# Patient Record
Sex: Female | Born: 1955 | Race: Asian | Hispanic: No | Marital: Married | Smoking: Never smoker
Health system: Southern US, Community
[De-identification: ages and names within clinical notes are randomized; demographics above are authoritative.]

## PROBLEM LIST (undated history)

## (undated) DIAGNOSIS — R51 Headache: Secondary | ICD-10-CM

## (undated) DIAGNOSIS — D259 Leiomyoma of uterus, unspecified: Secondary | ICD-10-CM

## (undated) DIAGNOSIS — Z780 Asymptomatic menopausal state: Secondary | ICD-10-CM

## (undated) DIAGNOSIS — K635 Polyp of colon: Secondary | ICD-10-CM

## (undated) DIAGNOSIS — R921 Mammographic calcification found on diagnostic imaging of breast: Secondary | ICD-10-CM

## (undated) DIAGNOSIS — E785 Hyperlipidemia, unspecified: Secondary | ICD-10-CM

## (undated) DIAGNOSIS — R519 Headache, unspecified: Secondary | ICD-10-CM

## (undated) HISTORY — DX: Hyperlipidemia, unspecified: E78.5

## (undated) HISTORY — DX: Mammographic calcification found on diagnostic imaging of breast: R92.1

## (undated) HISTORY — DX: Asymptomatic menopausal state: Z78.0

## (undated) HISTORY — PX: NO PAST SURGERIES: SHX2092

## (undated) HISTORY — DX: Headache, unspecified: R51.9

## (undated) HISTORY — DX: Polyp of colon: K63.5

## (undated) HISTORY — DX: Leiomyoma of uterus, unspecified: D25.9

## (undated) HISTORY — DX: Headache: R51

---

## 2010-09-22 ENCOUNTER — Ambulatory Visit: Payer: 59 | Admitting: Internal Medicine

## 2010-09-22 DIAGNOSIS — N951 Menopausal and female climacteric states: Secondary | ICD-10-CM

## 2010-09-22 DIAGNOSIS — J309 Allergic rhinitis, unspecified: Secondary | ICD-10-CM

## 2010-09-22 DIAGNOSIS — G43109 Migraine with aura, not intractable, without status migrainosus: Secondary | ICD-10-CM

## 2010-09-23 ENCOUNTER — Ambulatory Visit: Payer: 59 | Admitting: Internal Medicine

## 2010-09-23 ENCOUNTER — Other Ambulatory Visit (HOSPITAL_BASED_OUTPATIENT_CLINIC_OR_DEPARTMENT_OTHER): Payer: 59

## 2010-09-23 ENCOUNTER — Ambulatory Visit (HOSPITAL_BASED_OUTPATIENT_CLINIC_OR_DEPARTMENT_OTHER)
Admission: RE | Admit: 2010-09-23 | Discharge: 2010-09-23 | Disposition: A | Discharge status: Home / Self Care | Payer: 59 | Source: Ambulatory Visit | Attending: Internal Medicine | Admitting: Internal Medicine

## 2010-09-23 ENCOUNTER — Other Ambulatory Visit: Payer: Self-pay

## 2010-09-23 ENCOUNTER — Encounter (INDEPENDENT_AMBULATORY_CARE_PROVIDER_SITE_OTHER): Payer: Self-pay | Admitting: *Deleted

## 2010-09-23 ENCOUNTER — Other Ambulatory Visit: Payer: Self-pay | Admitting: Internal Medicine

## 2010-09-23 ENCOUNTER — Ambulatory Visit (INDEPENDENT_AMBULATORY_CARE_PROVIDER_SITE_OTHER)
Admission: RE | Admit: 2010-09-23 | Discharge: 2010-09-23 | Disposition: A | Discharge status: Home / Self Care | Payer: 59 | Source: Ambulatory Visit | Attending: Internal Medicine | Admitting: Internal Medicine

## 2010-09-23 DIAGNOSIS — D259 Leiomyoma of uterus, unspecified: Secondary | ICD-10-CM | POA: Insufficient documentation

## 2010-09-23 DIAGNOSIS — N938 Other specified abnormal uterine and vaginal bleeding: Secondary | ICD-10-CM

## 2010-09-23 DIAGNOSIS — Z1272 Encounter for screening for malignant neoplasm of vagina: Secondary | ICD-10-CM

## 2010-09-23 DIAGNOSIS — N946 Dysmenorrhea, unspecified: Secondary | ICD-10-CM

## 2010-09-23 DIAGNOSIS — J309 Allergic rhinitis, unspecified: Secondary | ICD-10-CM

## 2010-09-23 DIAGNOSIS — Z23 Encounter for immunization: Secondary | ICD-10-CM

## 2010-09-23 DIAGNOSIS — Z113 Encounter for screening for infections with a predominantly sexual mode of transmission: Secondary | ICD-10-CM

## 2010-09-23 DIAGNOSIS — N949 Unspecified condition associated with female genital organs and menstrual cycle: Secondary | ICD-10-CM

## 2010-09-23 DIAGNOSIS — Z01419 Encounter for gynecological examination (general) (routine) without abnormal findings: Secondary | ICD-10-CM

## 2010-09-23 DIAGNOSIS — E785 Hyperlipidemia, unspecified: Secondary | ICD-10-CM

## 2010-09-23 DIAGNOSIS — N951 Menopausal and female climacteric states: Secondary | ICD-10-CM

## 2010-09-23 DIAGNOSIS — Z139 Encounter for screening, unspecified: Secondary | ICD-10-CM

## 2010-09-26 ENCOUNTER — Telehealth: Payer: Self-pay | Admitting: Gastroenterology

## 2010-09-29 ENCOUNTER — Ambulatory Visit: Payer: 59 | Admitting: Internal Medicine

## 2010-09-30 NOTE — Letter (Signed)
Summary: Pre Visit Letter Revised  [facility]  [address] [place], [place] [address]   Phone: [phone]  Fax: [phone]        09/23/2010 MRN: [id number] Suzanne Harris [address] [place], [place]  [address]             Procedure Date:  10-28-10           Direct Colon--Dr. Arlyce Dice   Welcome to the Gastroenterology Division at [facility].    You are scheduled to see a nurse for your pre-procedure visit on 10-14-10 at 2:00p.m. on the 3rd floor at [facility], [address]  We ask that you try to arrive at our office 15 minutes prior to your appointment time to allow for check-in.  Please take a minute to review the attached form.  If you answer "Yes" to one or more of the questions on the first page, we ask that you call the person listed at your earliest opportunity.  If you answer "No" to all of the questions, please complete the rest of the form and bring it to your appointment.    Your nurse visit will consist of discussing your medical and surgical history, your immediate family medical history, and your medications.   If you are unable to list all of your medications on the form, please bring the medication bottles to your appointment and we will list them.  We will need to be aware of both prescribed and over the counter drugs.  We will need to know exact dosage information as well.    Please be prepared to read and sign documents such as consent forms, a financial agreement, and acknowledgement forms.  If necessary, and with your consent, a friend or relative is welcome to sit-in on the nurse visit with you.  Please bring your insurance card so that we may make a copy of it.  If your insurance requires a referral to see a specialist, please bring your referral form from your primary care physician.  No co-pay is required for this nurse visit.     If you cannot keep your appointment, please call [phone] to cancel or reschedule prior to  your appointment date.  This allows [place] the opportunity to schedule an appointment for another patient in need of care.    Thank you for choosing [facility] for your medical needs.  We appreciate the opportunity to care for you.  Please visit [place] at our website  to learn more about our practice.  Sincerely, The Gastroenterology Division

## 2010-10-01 ENCOUNTER — Other Ambulatory Visit: Payer: Self-pay | Admitting: Obstetrics & Gynecology

## 2010-10-01 ENCOUNTER — Encounter: Payer: 59 | Admitting: Obstetrics & Gynecology

## 2010-10-01 DIAGNOSIS — N949 Unspecified condition associated with female genital organs and menstrual cycle: Secondary | ICD-10-CM

## 2010-10-06 ENCOUNTER — Ambulatory Visit: Payer: 59 | Admitting: Internal Medicine

## 2010-10-06 DIAGNOSIS — N951 Menopausal and female climacteric states: Secondary | ICD-10-CM

## 2010-10-06 DIAGNOSIS — E785 Hyperlipidemia, unspecified: Secondary | ICD-10-CM

## 2010-10-06 DIAGNOSIS — G43909 Migraine, unspecified, not intractable, without status migrainosus: Secondary | ICD-10-CM

## 2010-10-14 NOTE — Progress Notes (Signed)
Summary: Previsit Letter(Waiting on Colon report)  Phone Note Call from Patient Call back at Home Phone [phone]   Summary of Call: Pt called stated she recieved a previsit questionare.Marland KitchenMarland KitchenShe had a colonoscopy in 2009 in [place].Marland KitchenPt is to fax [place] records of her colon and pathology report and for Dr Arlyce Dice to review to see if pt still needs to continue with previsit and procedure. Initial call taken by: Merri Ray CMA Duncan Dull),  September 26, 2010 4:33 PM  Follow-up for Phone Call        Dr Arlyce Dice, This pt is scheduled for a Previsit and a Colon but had procedure in [place] in 2009. We have not recieved her reports yet. Do you want to still keep her on the schedule? Follow-up by: Merri Ray CMA Duncan Dull),  October 03, 2010 2:30 PM  Additional Follow-up for Phone Call Additional follow up Details #1::         let's put this on hold until we were reviewing her last colonoscopy Additional Follow-up by: Louis Meckel MD,  October 03, 2010 2:58 PM    Additional Follow-up for Phone Call Additional follow up Details #2::    Called pt today, She is to sign medical release at her PCP's office today. Will fax report to me attention Robin, hopefully today Follow-up by: Merri Ray CMA Duncan Dull),  October 06, 2010 8:44 AM  Additional Follow-up for Phone Call Additional follow up Details #3:: Details for Additional Follow-up Action Taken: Dr Arlyce Dice, I have contacted this pt, but we still do not her copies of procedures available. Do you want me to cancel her appointments....YES  Called pt to inform that Dr Arlyce Dice needs to see those records. Have not recieved yet..L/M for pt if she does not have her records that she needs to call and cancel her appointments until 2009 procedure can be reviewed Additional Follow-up by: Merri Ray CMA Duncan Dull),  October 08, 2010 8:58 AM  The pt called and said she wanted to cancel the previsit and the colonscopy scheduled for 10-28-2010. She is calling her Family MD  in [place] to see if she can get her records.  She said she will call back and reschedule once she get the records.  She wanted me to let Robin know. I did go into the Epic system and cancelled the Previsit and the colonoscopy. Per Lowry Ram

## 2010-10-24 NOTE — Assessment & Plan Note (Signed)
Suzanne Harris, PERDOMO NO.:  [id number]  MEDICAL RECORD NO.:  [id number]           PATIENT TYPE:  LOCATION:  [facility]           FACILITY:  PHYSICIAN:  Allie Bossier, MD        DATE OF BIRTH:  1956-01-02  DATE OF SERVICE:  10/01/2010                                 CLINIC NOTE  Ms. Kaufhold is a 55 year old Filipino American married, G2, P2, she has 62 and 41 year old children see as a referral from Dr. Constance Goltz due to her dysfunctional uterine bleeding.  Please note that she says her last normal period was probably in August 2011, but since August, she has had some irregular bleeding, some months she will skip, some months she will have some very heavy bleeding and other months she will does have a little light spotting.  She has had no period over the last 2 months. She does complain of a year's history of hot flashes, but she says these have become increasingly worse and daily over the last 2-3 months.  She is not having intercourse for months due to her symptomatic hot flashes.  PAST MEDICAL HISTORY:  She has a known history of fibroids and was recently diagnosed with elevated cholesterol.  MEDICATIONS:  Zyrtec, multivitamin, vitamin D, calcium and she is currently on Keflex for skin infection.  REVIEW OF SYSTEMS:  She moved from [place] in about 2010.  She has been married for 32 years.  She is a Futures trader.  The remainder of review of systems questions are negative.  Her Pap smear and mammogram were both done in the last month.  No drug allergies.  She is allergic to LATEX.  FAMILY HISTORY:  Negative for breast and GYN cancers; however, first cousin did have colon cancer.  Her father died of an abdominal aortic aneurysm and her mother from complications of diabetes.  SOCIAL HISTORY:  She drinks alcohol socially, but no tobacco or drugs.  PHYSICAL EXAMINATION:  GENERAL:  Very pleasant Filipino patient. VITAL SIGNS:  Her height is 5 feet and 1  inches, weight 132 pounds, blood pressure 136/88, pulse 80. PELVIC:  External genitalia; no lesions, minimal atrophy.  She has a pubic arch that did allow the delivery of a 9 pounds baby.  Her uterus is 6-week size, midplane, mobile.  Her adnexa are nontender and her adnexa are not enlarged.  Ultrasound shows the presence of several fibroids, the largest of which is measuring 3.4 cm.  Her endometrial stripe was noted to be 9 mm and her ovaries appeared normal.  There was no adnexal masses or free fluid noted.  ASSESSMENT AND PLAN:  Dysfunctional uterine bleeding and perimenopausal women.  I prepped her cervix with iodine and grasped the anterior lip of the cervix with a single-tooth tenaculum.  I then did an endometrial biopsy with the [place], I did two passes and got an adequate amount of endometrial tissue.  She tolerated the procedure well.  She will come back on a p.r.n. basis.  She is scheduled to see Dr. Constance Goltz next week and I am hopeful that the results of the endometrial biopsy will be available then.     Allie Bossier, MD  MCD/MEDQ  D:  10/01/2010  T:  10/01/2010  Job:  [id number]  cc:   Kendrick Ranch, MD

## 2010-10-28 ENCOUNTER — Other Ambulatory Visit: Payer: 59 | Admitting: Gastroenterology

## 2010-12-08 ENCOUNTER — Ambulatory Visit (INDEPENDENT_AMBULATORY_CARE_PROVIDER_SITE_OTHER): Payer: 59 | Admitting: Internal Medicine

## 2010-12-08 DIAGNOSIS — G43109 Migraine with aura, not intractable, without status migrainosus: Secondary | ICD-10-CM

## 2010-12-08 DIAGNOSIS — E785 Hyperlipidemia, unspecified: Secondary | ICD-10-CM

## 2010-12-08 DIAGNOSIS — N951 Menopausal and female climacteric states: Secondary | ICD-10-CM

## 2010-12-17 ENCOUNTER — Encounter: Payer: Self-pay | Admitting: Internal Medicine

## 2011-10-04 ENCOUNTER — Other Ambulatory Visit: Payer: Self-pay | Admitting: Internal Medicine

## 2011-10-04 DIAGNOSIS — E785 Hyperlipidemia, unspecified: Secondary | ICD-10-CM

## 2011-10-04 DIAGNOSIS — K635 Polyp of colon: Secondary | ICD-10-CM

## 2011-10-04 DIAGNOSIS — D219 Benign neoplasm of connective and other soft tissue, unspecified: Secondary | ICD-10-CM

## 2011-10-04 DIAGNOSIS — Z780 Asymptomatic menopausal state: Secondary | ICD-10-CM

## 2011-10-04 DIAGNOSIS — N951 Menopausal and female climacteric states: Secondary | ICD-10-CM

## 2011-10-05 DIAGNOSIS — N951 Menopausal and female climacteric states: Secondary | ICD-10-CM | POA: Insufficient documentation

## 2011-10-05 DIAGNOSIS — E785 Hyperlipidemia, unspecified: Secondary | ICD-10-CM | POA: Insufficient documentation

## 2011-10-05 DIAGNOSIS — Z780 Asymptomatic menopausal state: Secondary | ICD-10-CM | POA: Insufficient documentation

## 2011-10-05 DIAGNOSIS — D219 Benign neoplasm of connective and other soft tissue, unspecified: Secondary | ICD-10-CM | POA: Insufficient documentation

## 2011-10-05 DIAGNOSIS — K635 Polyp of colon: Secondary | ICD-10-CM | POA: Insufficient documentation

## 2011-10-05 DIAGNOSIS — J309 Allergic rhinitis, unspecified: Secondary | ICD-10-CM | POA: Insufficient documentation

## 2011-10-05 NOTE — Telephone Encounter (Signed)
Suzanne Harris,  Call pt and tell her she is overdue for her mammogram.  I will refill her estrogen as soon as I have mammogram report.  Also make sure she has her prometrium to take every day with her estrogen.  She is overdue for a CPE (last done 09/23/2010).  Schedule this with her if she wishes.

## 2011-10-05 NOTE — Telephone Encounter (Signed)
Spoke with Suzanne Harris.  She states that she is out of [employer].  I advised her that, per DDS, she can refill once she has mammogram report- for pt safety.  She states that she has not yet scheduled mammogram.  I offered to transfer the call to imaging, but she denied stating she needed to check her calendar for the next week.  I also advised her she is due for CPE, she states she has a lot going on right now and will call back for CPE appt.  She states she has been taking prometrium with Elestrin, but stopped when she ran out of [employer]

## 2011-10-06 ENCOUNTER — Other Ambulatory Visit: Payer: Self-pay | Admitting: Internal Medicine

## 2011-10-06 DIAGNOSIS — Z1231 Encounter for screening mammogram for malignant neoplasm of breast: Secondary | ICD-10-CM

## 2011-10-07 ENCOUNTER — Ambulatory Visit (HOSPITAL_BASED_OUTPATIENT_CLINIC_OR_DEPARTMENT_OTHER)
Admission: RE | Admit: 2011-10-07 | Discharge: 2011-10-07 | Disposition: A | Discharge status: Home / Self Care | Payer: 59 | Source: Ambulatory Visit | Attending: Internal Medicine | Admitting: Internal Medicine

## 2011-10-07 DIAGNOSIS — Z1231 Encounter for screening mammogram for malignant neoplasm of breast: Secondary | ICD-10-CM

## 2011-10-12 ENCOUNTER — Telehealth: Payer: Self-pay | Admitting: Internal Medicine

## 2011-10-12 MED ORDER — ESTRADIOL 0.52 MG/0.87 GM (0.06%) TD GEL: TRANSDERMAL | Status: DC

## 2011-10-12 NOTE — Telephone Encounter (Signed)
Pt aware mammogram okay and Elestrin refilled.  Advised she will call back to schedule CPE when she has her calendar available

## 2011-10-12 NOTE — Telephone Encounter (Signed)
Call pt and let her know that her mammogram is fine and I reordered her elestrin.  See if you can schedule a CPE for her and message back with her response.

## 2011-11-19 ENCOUNTER — Encounter: Payer: Self-pay | Admitting: Internal Medicine

## 2011-11-19 ENCOUNTER — Ambulatory Visit (INDEPENDENT_AMBULATORY_CARE_PROVIDER_SITE_OTHER): Payer: 59 | Admitting: Internal Medicine

## 2011-11-19 VITALS — BP 136/84 | HR 76 | Temp 97.1°F | Resp 16 | Ht 61.0 in | Wt 127.0 lb

## 2011-11-19 DIAGNOSIS — E785 Hyperlipidemia, unspecified: Secondary | ICD-10-CM

## 2011-11-19 DIAGNOSIS — E28319 Asymptomatic premature menopause: Secondary | ICD-10-CM

## 2011-11-19 DIAGNOSIS — Z8742 Personal history of other diseases of the female genital tract: Secondary | ICD-10-CM

## 2011-11-19 DIAGNOSIS — R921 Mammographic calcification found on diagnostic imaging of breast: Secondary | ICD-10-CM | POA: Insufficient documentation

## 2011-11-19 DIAGNOSIS — R928 Other abnormal and inconclusive findings on diagnostic imaging of breast: Secondary | ICD-10-CM

## 2011-11-19 LAB — CBC WITH DIFFERENTIAL/PLATELET
Eosinophils Absolute: 0.2 10*3/uL (ref 0.0–0.7)
Hemoglobin: 14.8 g/dL (ref 12.0–15.0)
Lymphocytes Relative: 32 % (ref 12–46)
Lymphs Abs: 1.5 10*3/uL (ref 0.7–4.0)
MCH: 31.4 pg (ref 26.0–34.0)
Monocytes Relative: 6 % (ref 3–12)
Neutro Abs: 2.7 10*3/uL (ref 1.7–7.7)
Neutrophils Relative %: 58 % (ref 43–77)
Platelets: 198 10*3/uL (ref 150–400)
RBC: 4.71 MIL/uL (ref 3.87–5.11)
WBC: 4.7 10*3/uL (ref 4.0–10.5)

## 2011-11-19 LAB — COMPLETE METABOLIC PANEL WITH GFR
ALT: 29 U/L (ref 0–35)
BUN: 13 mg/dL (ref 6–23)
CO2: 25 mEq/L (ref 19–32)
Calcium: 9.1 mg/dL (ref 8.4–10.5)
Chloride: 103 mEq/L (ref 96–112)
Creat: 0.73 mg/dL (ref 0.50–1.10)
GFR, Est African American: >89 mL/min
GFR, Est Non African American: >89 mL/min
Glucose, Bld: 100 mg/dL — ABNORMAL HIGH (ref 70–99)
Total Bilirubin: 0.7 mg/dL (ref 0.3–1.2)

## 2011-11-19 LAB — LIPID PANEL
Cholesterol: 232 mg/dL — ABNORMAL HIGH (ref 0–200)
HDL: 59 mg/dL (ref >39)
Total CHOL/HDL Ratio: 3.9 Ratio
Triglycerides: 111 mg/dL (ref <150)
VLDL: 22 mg/dL (ref 0–40)

## 2011-11-19 MED ORDER — ESTRADIOL 0.52 MG/0.87 GM (0.06%) TD GEL: TRANSDERMAL | Status: DC

## 2011-11-19 MED ORDER — PROGESTERONE MICRONIZED 100 MG PO CAPS: 100.0000 mg | ORAL_CAPSULE | Freq: Every day | ORAL | Status: DC

## 2011-11-19 NOTE — Progress Notes (Signed)
Subjective:    Patient ID: Suzanne Harris, female    DOB: 11/09/1955, 56 y.o.   MRN: [id number]  HPI Suzanne Harris is here to follow up on her hyperlipidemia and HT.  She reports she has been following DASH diet.  See last lipids.  Lipids markedly improved from prior testing  She is fasting today  She does report that she still has some monthly bleeding.  Last month it was "heavy"  TVUS revealed 3 fibroids and enodmetrial bx done 10/01/2010 showed atrophic endometrium w/o evidence of malignancy.  Vasomotor symptoms markedly improved with combination of Elestrin and Prometrium 100 mg daily.   No Known Allergies Past Medical History  Diagnosis Date  . Menopause     with vasomotor flushes  . Allergic rhinitis   . Colon polyps   . Generalized headaches     ? migraine  . Breast calcifications   . Uterine fibroid     negative biopsy 2/12  . Hyperlipidemia   . Facial palsy as birth trauma    Past Surgical History  Procedure Date  . No past surgeries    History   Social History  . Marital Status: Married    Spouse Name: [name]    Number of Children: 2  . Years of Education: college   Occupational History  . Homemaker    Social History Main Topics  . Smoking status: Never Smoker   . Smokeless tobacco: Never Used  . Alcohol Use: 0.0 oz/week    3-5 drink(s) per week  . Drug Use: No  . Sexually Active: Yes -- Female partner(s)    Birth Control/ Protection: Post-menopausal   Other Topics Concern  . Not on file   Social History Narrative  . No narrative on file   Family History  Problem Relation Age of Onset  . Diabetes    . Hypertension    . Colon cancer Cousin    Patient Active Problem List  Diagnoses  . Allergic rhinitis  . Menopause  . Hot flushes, perimenopausal  . Colon polyps  . Generalized headaches  . Fibroids  . Hyperlipidemia  . Facial palsy as birth trauma   Current Outpatient Prescriptions on File Prior to Visit  Medication Sig Dispense Refill  .  cetirizine (ZYRTEC) 10 MG tablet Take 10 mg by mouth daily.        . Estradiol (ELESTRIN) 0.52 MG/0.87 GM (0.06%) GEL 2 pumps to arm daily  2 Bottle  1  . Multiple Vitamin (MULTIVITAMIN) tablet Take 1 tablet by mouth daily.        . progesterone (PROMETRIUM) 100 MG capsule Take 100 mg by mouth daily.           Review of Systems    see HPI Objective:   Physical Exam Physical Exam  Nursing note and vitals reviewed.  Constitutional: She is oriented to person, place, and time. She appears well-developed and well-nourished.  HENT:  Head: Normocephalic and atraumatic.  Cardiovascular: Normal rate and regular rhythm. Exam reveals no gallop and no friction rub.  No murmur heard.  Pulmonary/Chest: Breath sounds normal. She has no wheezes. She has no rales.  Neurological: She is alert and oriented to person, place, and time.  Skin: Skin is warm and dry.  Psychiatric: She has a normal mood and affect. Her behavior is normal.        Assessment & Plan:  1)  Hyperlipidemia  Will check fasting today 2)  Menstrual bleeding  Will recheck pelvic/ TVUS to assess  endometrial thickness.   3)  Vasomotor flushing well controlled.  Pt again counseld if any Le edema or calf/thigh pain to calloffice immediately to be evaluated for DVT.  She voices understanding  Schedule CPE

## 2011-11-19 NOTE — Patient Instructions (Signed)
Schedule CPE with me  To have pelvic ultrasound  Labs will be mailed to you

## 2011-11-27 NOTE — Progress Notes (Signed)
Appt made for 12/02/11 with Dr Constance Goltz to discuss elevated lipids.

## 2011-12-02 ENCOUNTER — Ambulatory Visit (INDEPENDENT_AMBULATORY_CARE_PROVIDER_SITE_OTHER): Payer: 59 | Admitting: Internal Medicine

## 2011-12-02 ENCOUNTER — Encounter: Payer: Self-pay | Admitting: Internal Medicine

## 2011-12-02 VITALS — BP 138/80 | HR 86 | Temp 97.9°F | Resp 16 | Ht 61.0 in | Wt 127.0 lb

## 2011-12-02 DIAGNOSIS — E785 Hyperlipidemia, unspecified: Secondary | ICD-10-CM

## 2011-12-02 MED ORDER — SIMVASTATIN 5 MG PO TABS: 5.0000 mg | ORAL_TABLET | Freq: Every day | ORAL | Status: DC

## 2011-12-02 NOTE — Patient Instructions (Signed)
Take med daily  See me in 2 monthsd

## 2011-12-02 NOTE — Progress Notes (Signed)
Subjective:    Patient ID: Suzanne Harris, female    DOB: [birth year], 56 y.o.   MRN: [id number]  HPI  Sheniece is here to follow up on her hyperlipidemia.  Strong FH of high cholesterol but no MI that she is aware of.   Father died of a femoral aneurysm.    Framingham risk calculator shows 9.9% CV for ensuing 10 years.  Reviewed with pt  No Known Allergies Past Medical History  Diagnosis Date  . Menopause     with vasomotor flushes  . Allergic rhinitis   . Colon polyps   . Generalized headaches     ? migraine  . Breast calcifications   . Uterine fibroid     negative biopsy 2/12  . Hyperlipidemia   . Facial palsy as birth trauma    Past Surgical History  Procedure Date  . No past surgeries    History   Social History  . Marital Status: Married    Spouse Name: [name]    Number of Children: 2  . Years of Education: college   Occupational History  . Homemaker    Social History Main Topics  . Smoking status: Never Smoker   . Smokeless tobacco: Never Used  . Alcohol Use: 0.0 oz/week    3-5 drink(s) per week  . Drug Use: No  . Sexually Active: Yes -- Female partner(s)    Birth Control/ Protection: Post-menopausal   Other Topics Concern  . Not on file   Social History Narrative  . No narrative on file   Family History  Problem Relation Age of Onset  . Diabetes    . Hypertension    . Colon cancer Cousin    Patient Active Problem List  Diagnoses  . Allergic rhinitis  . Menopause  . Hot flushes, perimenopausal  . Colon polyps  . Generalized headaches  . Fibroids  . Hyperlipidemia  . Facial palsy as birth trauma  . Breast calcifications   Current Outpatient Prescriptions on File Prior to Visit  Medication Sig Dispense Refill  . cetirizine (ZYRTEC) 10 MG tablet Take 10 mg by mouth daily.        . Estradiol (ELESTRIN) 0.52 MG/0.87 GM (0.06%) GEL 2 pumps to arm daily  2 Bottle  1  . Multiple Vitamin (MULTIVITAMIN) tablet Take 1 tablet by mouth daily.          . progesterone (PROMETRIUM) 100 MG capsule Take 1 capsule (100 mg total) by mouth daily.  30 capsule  5  . simvastatin (ZOCOR) 5 MG tablet Take 1 tablet (5 mg total) by mouth at bedtime.  30 tablet  2       Review of Systems    see HPI Objective:   Physical Exam Physical Exam  Nursing note and vitals reviewed.  Constitutional: She is oriented to person, place, and time. She appears well-developed and well-nourished.  HENT:  Head: Normocephalic and atraumatic.  Cardiovascular: Normal rate and regular rhythm. Exam reveals no gallop and no friction rub.  No murmur heard.  Pulmonary/Chest: Breath sounds normal. She has no wheezes. She has no rales.  Neurological: She is alert and oriented to person, place, and time.  Skin: Skin is warm and dry.  Psychiatric: She has a normal mood and affect. Her behavior is normal.       Assessment & Plan:  1)  Hyperlipidemia  Extensively reviewed Se profile of startins including Lft elevation and myalgias.  Will try low dose Zocor daily or 3 times  per week.  Pt agreeable   See me in 2 months  2)  Elevated BP  Repeat exam is withing normal range.  Watch expectantly

## 2011-12-22 ENCOUNTER — Ambulatory Visit (HOSPITAL_BASED_OUTPATIENT_CLINIC_OR_DEPARTMENT_OTHER)
Admission: RE | Admit: 2011-12-22 | Discharge: 2011-12-22 | Disposition: A | Discharge status: Home / Self Care | Payer: 59 | Source: Ambulatory Visit | Attending: Internal Medicine | Admitting: Internal Medicine

## 2011-12-22 DIAGNOSIS — N852 Hypertrophy of uterus: Secondary | ICD-10-CM | POA: Insufficient documentation

## 2011-12-22 DIAGNOSIS — D259 Leiomyoma of uterus, unspecified: Secondary | ICD-10-CM | POA: Insufficient documentation

## 2011-12-22 DIAGNOSIS — N938 Other specified abnormal uterine and vaginal bleeding: Secondary | ICD-10-CM | POA: Insufficient documentation

## 2011-12-22 DIAGNOSIS — N949 Unspecified condition associated with female genital organs and menstrual cycle: Secondary | ICD-10-CM | POA: Insufficient documentation

## 2011-12-22 DIAGNOSIS — Z8742 Personal history of other diseases of the female genital tract: Secondary | ICD-10-CM

## 2011-12-28 ENCOUNTER — Telehealth: Payer: Self-pay | Admitting: Internal Medicine

## 2011-12-28 DIAGNOSIS — D259 Leiomyoma of uterus, unspecified: Secondary | ICD-10-CM

## 2011-12-28 NOTE — Telephone Encounter (Signed)
Spoke with pt and informed of fibroids and uterine enlargement with ??? Adenomyoisis.  She reports she continues to bleed monthly , sometimes 5 weeks between cycles.  Will refer to Dr. Marice Potter who she has seen in past to discuss options to control bleeding.  There is no anemia and endometrial stripe is thin

## 2012-01-13 ENCOUNTER — Ambulatory Visit (INDEPENDENT_AMBULATORY_CARE_PROVIDER_SITE_OTHER): Payer: 59 | Admitting: Obstetrics & Gynecology

## 2012-01-13 ENCOUNTER — Encounter: Payer: Self-pay | Admitting: Obstetrics & Gynecology

## 2012-01-13 VITALS — BP 134/79 | HR 78 | Temp 98.6°F | Resp 16 | Ht 61.0 in | Wt 125.0 lb

## 2012-01-13 DIAGNOSIS — N950 Postmenopausal bleeding: Secondary | ICD-10-CM

## 2012-01-13 NOTE — Progress Notes (Signed)
  Subjective:    Patient ID: Suzanne Harris, female    DOB: 01-23-1956, 56 y.o.   MRN: [id number]  HPI  Suzanne Harris is a lovely 56 yo lady with known fibroids. She was sent here for evaluation of PMB related either to her fibroids or her continuous HRT. She says that she has a little spotting about every 2 weeks. She denies dysparunia or pelvic pain. The spotting is not that concerning to her. She had an u/s that showed an endometrial lining of 3 mm. Her uterus is now 13.2 cm (was 10.9 cm).  Review of Systems   She is having a pap smear next month.  Objective:   Physical Exam        Assessment & Plan:   As above- I have offered her watchful waiting versus changing her HRT to cyclic fashion so that she would have a "period" once a month instead of bimonthly spotting. She prefers the status quo. She will get her pap next month and come to me if she develops any pelvic pain or dysparunia.

## 2012-02-02 ENCOUNTER — Ambulatory Visit (HOSPITAL_BASED_OUTPATIENT_CLINIC_OR_DEPARTMENT_OTHER)
Admission: RE | Admit: 2012-02-02 | Discharge: 2012-02-02 | Disposition: A | Discharge status: Home / Self Care | Payer: 59 | Source: Ambulatory Visit | Attending: Internal Medicine | Admitting: Internal Medicine

## 2012-02-02 ENCOUNTER — Encounter: Payer: Self-pay | Admitting: Internal Medicine

## 2012-02-02 ENCOUNTER — Ambulatory Visit (INDEPENDENT_AMBULATORY_CARE_PROVIDER_SITE_OTHER): Payer: 59 | Admitting: Internal Medicine

## 2012-02-02 VITALS — BP 132/84 | HR 64 | Temp 97.5°F | Resp 16 | Ht 61.0 in | Wt 124.0 lb

## 2012-02-02 DIAGNOSIS — N951 Menopausal and female climacteric states: Secondary | ICD-10-CM

## 2012-02-02 DIAGNOSIS — E785 Hyperlipidemia, unspecified: Secondary | ICD-10-CM

## 2012-02-02 DIAGNOSIS — M2550 Pain in unspecified joint: Secondary | ICD-10-CM | POA: Insufficient documentation

## 2012-02-02 DIAGNOSIS — N800 Endometriosis of uterus: Secondary | ICD-10-CM

## 2012-02-02 DIAGNOSIS — D259 Leiomyoma of uterus, unspecified: Secondary | ICD-10-CM

## 2012-02-02 DIAGNOSIS — D219 Benign neoplasm of connective and other soft tissue, unspecified: Secondary | ICD-10-CM

## 2012-02-02 DIAGNOSIS — K635 Polyp of colon: Secondary | ICD-10-CM

## 2012-02-02 DIAGNOSIS — Z780 Asymptomatic menopausal state: Secondary | ICD-10-CM

## 2012-02-02 DIAGNOSIS — Z0000 Encounter for general adult medical examination without abnormal findings: Secondary | ICD-10-CM

## 2012-02-02 DIAGNOSIS — N898 Other specified noninflammatory disorders of vagina: Secondary | ICD-10-CM

## 2012-02-02 DIAGNOSIS — D126 Benign neoplasm of colon, unspecified: Secondary | ICD-10-CM

## 2012-02-02 DIAGNOSIS — N939 Abnormal uterine and vaginal bleeding, unspecified: Secondary | ICD-10-CM | POA: Insufficient documentation

## 2012-02-02 DIAGNOSIS — Z01419 Encounter for gynecological examination (general) (routine) without abnormal findings: Secondary | ICD-10-CM

## 2012-02-02 LAB — POCT URINALYSIS DIPSTICK
Glucose, UA: NEGATIVE
Ketones, UA: NEGATIVE
Leukocytes, UA: NEGATIVE
Protein, UA: NEGATIVE
Spec Grav, UA: <=1.005
Urobilinogen, UA: NEGATIVE

## 2012-02-02 LAB — TSH: TSH: 1.485 u[IU]/mL (ref 0.350–4.500)

## 2012-02-02 MED ORDER — NABUMETONE 500 MG PO TABS: ORAL_TABLET | ORAL | Status: DC

## 2012-02-02 NOTE — Patient Instructions (Addendum)
Call office tommorrow

## 2012-02-02 NOTE — Progress Notes (Signed)
Subjective:    Patient ID: Suzanne Harris, female    DOB: Jan 07, 1956, 56 y.o.   MRN: [id number]  HPI Suzanne Harris is here for comprehensive eval.    She did see Dr. Marice Potter for her vaginal spotting and chose not to change her HT now - she has known fibroids and uterine enlargement by about 3 cm.  She did not want cyclic HT .  She denies dyspareunia.  She did not see Dr. Arlyce Dice as yet for her colonoscopy as she was trying to get records from [facility] but has not found them.  She believes her last colonoscopy was 2008 or 2009.    Marland Kitchen  Vasomotor symptoms under control.   She does note swelling in joints of her fingers , esp DIp joint of R 3rd digit. No redness or warmth.  No FH of arthritis.  She has been working in yard quite a bit.     No Known Allergies Past Medical History  Diagnosis Date  . Menopause     with vasomotor flushes  . Allergic rhinitis   . Colon polyps   . Generalized headaches     ? migraine  . Breast calcifications   . Uterine fibroid     negative biopsy 2/12  . Hyperlipidemia   . Facial palsy as birth trauma    Past Surgical History  Procedure Date  . No past surgeries    History   Social History  . Marital Status: Married    Spouse Name: [name]    Number of Children: 2  . Years of Education: college   Occupational History  . Homemaker    Social History Main Topics  . Smoking status: Never Smoker   . Smokeless tobacco: Never Used  . Alcohol Use: 0.0 oz/week    3-5 drink(s) per week  . Drug Use: No  . Sexually Active: Yes -- Female partner(s)    Birth Control/ Protection: Post-menopausal   Other Topics Concern  . Not on file   Social History Narrative  . No narrative on file   Family History  Problem Relation Age of Onset  . Diabetes    . Hypertension    . Colon cancer Cousin    Patient Active Problem List  Diagnosis  . Allergic rhinitis  . Menopause  . Hot flushes, perimenopausal  . Colon polyps  . Generalized headaches  . Fibroids  .  Hyperlipidemia  . Facial palsy as birth trauma  . Breast calcifications  . Arthralgia  . Uterus, adenomyosis  . Vaginal spotting   Current Outpatient Prescriptions on File Prior to Visit  Medication Sig Dispense Refill  . cetirizine (ZYRTEC) 10 MG tablet Take 10 mg by mouth daily.        . Estradiol (ELESTRIN) 0.52 MG/0.87 GM (0.06%) GEL 2 pumps to arm daily  2 Bottle  1  . Multiple Vitamin (MULTIVITAMIN) tablet Take 1 tablet by mouth daily.        . progesterone (PROMETRIUM) 100 MG capsule Take 1 capsule (100 mg total) by mouth daily.  30 capsule  5  . simvastatin (ZOCOR) 5 MG tablet Take 1 tablet (5 mg total) by mouth at bedtime.  30 tablet  2       Review of Systems  Constitutional: Negative.   HENT: Negative.   Eyes: Negative.   Respiratory: Negative.   Cardiovascular: Negative.   Gastrointestinal: Negative.   Genitourinary: Negative.   Musculoskeletal: Positive for joint swelling and arthralgias.  Skin: Negative.   Psychiatric/Behavioral:  Negative.        Objective:   Physical Exam  Physical Exam  Vital signs and nursing note reviewed  Constitutional: She is oriented to person, place, and time. She appears well-developed and well-nourished. She is cooperative.  HENT:  Head: Normocephalic and atraumatic.  Right Ear: Tympanic membrane normal.  Left Ear: Tympanic membrane normal.  Nose: Nose normal.  Mouth/Throat: Oropharynx is clear and moist and mucous membranes are normal. No oropharyngeal exudate or posterior oropharyngeal erythema.  Eyes: Conjunctivae and EOM are normal. Pupils are equal, round, and reactive to light.  Neck: Neck supple. No JVD present. Carotid bruit is not present. No mass and no thyromegaly present.  Cardiovascular: Regular rhythm, normal heart sounds, intact distal pulses and normal pulses.  Exam reveals no gallop and no friction rub.   No murmur heard. Pulses:      Dorsalis pedis pulses are 2+ on the right side, and 2+ on the left side.    Pulmonary/Chest: Breath sounds normal. She has no wheezes. She has no rhonchi. She has no rales. Right breast exhibits no mass, no nipple discharge and no skin change. Left breast exhibits no mass, no nipple discharge and no skin change.  Abdominal: Soft. Bowel sounds are normal. She exhibits no distension and no mass. There is no hepatosplenomegaly. There is no tenderness. There is no CVA tenderness.  Genitourinary: Rectum normal, vagina normal and uterus normal. Rectal exam shows no mass. Guaiac negative stool. No labial fusion. There is no lesion on the right labia. There is no lesion on the left labia. Cervix exhibits no motion tenderness. Right adnexum displays no mass, no tenderness and no fullness. Left adnexum displays no mass, no tenderness and no fullness. No erythema around the vagina.  Musculoskeletal:       She does have signifgicant edema of 3rd digit R hand and heberdon's and Bouchard's nodes of both hands.    No redness or warmth Lymphadenopathy:       Right cervical: No superficial cervical adenopathy present.      Left cervical: No superficial cervical adenopathy present.       Right axillary: No pectoral and no lateral adenopathy present.       Left axillary: No pectoral and no lateral adenopathy present.      Right: No inguinal adenopathy present.       Left: No inguinal adenopathy present.  Neurological: She is alert and oriented to person, place, and time. She has normal strength and normal reflexes. No cranial nerve deficit or sensory deficit. She displays a negative Romberg sign. Coordination and gait normal.  Skin: Skin is warm and dry. No abrasion, no bruising, no ecchymosis and no rash noted. No cyanosis. Nails show no clubbing.  Psychiatric: She has a normal mood and affect. Her speech is normal and behavior is normal.          Assessment & Plan:   Health Maintenance  UTD on vaccines and mammogram. I gave pt phone number to Dr. Marzetta Board office to reschedule visit.   She did have a polyp removed in [place].  Gave info on shingles vaccine and she is going to check with insurance company  Hyperlipidemia  Tolerating low dose Zocor well will check lipids today  Arthralgias:  Will check arthriitis panel and x-rays today.  Advised Relafen 500 mg bid along with 20 mg of Prevacid.   She voiced understanding  Vaginal spotting/fibroids/ uterine adenomyosis  See dr. Ellin Saba note.  If any bothersome symptoms she  is aware to follow with Dr. Marice Potter  History of colonic polyp  I gave pt number to Dr.Kaplan;s office for pt to reschedule  Vasomotor flushes well controlled  Allergic rhinitis     History of breast calcifications       Assessment & Plan:

## 2012-02-03 ENCOUNTER — Telehealth: Payer: Self-pay | Admitting: Internal Medicine

## 2012-02-03 LAB — LIPID PANEL
LDL Cholesterol: 99 mg/dL (ref 0–99)
Triglycerides: 189 mg/dL — ABNORMAL HIGH (ref <150)
VLDL: 38 mg/dL (ref 0–40)

## 2012-02-03 LAB — COMPREHENSIVE METABOLIC PANEL
ALT: 18 U/L (ref 0–35)
Albumin: 4.5 g/dL (ref 3.5–5.2)
Alkaline Phosphatase: 43 U/L (ref 39–117)
Glucose, Bld: 85 mg/dL (ref 70–99)
Potassium: 4.4 mEq/L (ref 3.5–5.3)
Sodium: 139 mEq/L (ref 135–145)
Total Bilirubin: 0.7 mg/dL (ref 0.3–1.2)
Total Protein: 7 g/dL (ref 6.0–8.3)

## 2012-02-03 LAB — ANA: Anti Nuclear Antibody(ANA): NEGATIVE

## 2012-02-03 LAB — RHEUMATOID FACTOR: Rhuematoid fact SerPl-aCnc: <10 IU/mL (ref <=14)

## 2012-02-03 LAB — SEDIMENTATION RATE: Sed Rate: 4 mm/hr (ref 0–22)

## 2012-02-03 NOTE — Telephone Encounter (Signed)
Attempted to call pt regarding x-rays and labs.  Left message to call office on Monday

## 2012-02-08 ENCOUNTER — Telehealth: Payer: Self-pay | Admitting: Internal Medicine

## 2012-02-08 DIAGNOSIS — M1990 Unspecified osteoarthritis, unspecified site: Secondary | ICD-10-CM

## 2012-02-08 NOTE — Telephone Encounter (Signed)
Pt states she was returning phone call Dr. Constance Goltz called... Please call pt back at [phone]

## 2012-02-08 NOTE — Telephone Encounter (Signed)
Spoke with pt and informed of labs and X-ray results.  With small erosions of R hand will refer to rheumatology  She voices understanding

## 2012-02-09 ENCOUNTER — Other Ambulatory Visit: Payer: Self-pay | Admitting: Internal Medicine

## 2012-02-09 ENCOUNTER — Telehealth: Payer: Self-pay | Admitting: *Deleted

## 2012-02-09 NOTE — Telephone Encounter (Signed)
Copy of labs mailed to pt's home address. 

## 2012-02-21 ENCOUNTER — Other Ambulatory Visit: Payer: Self-pay | Admitting: Internal Medicine

## 2012-02-26 ENCOUNTER — Other Ambulatory Visit: Payer: Self-pay | Admitting: Internal Medicine

## 2012-04-26 ENCOUNTER — Other Ambulatory Visit: Payer: Self-pay | Admitting: Internal Medicine

## 2012-05-02 ENCOUNTER — Other Ambulatory Visit: Payer: Self-pay | Admitting: Internal Medicine

## 2012-05-16 ENCOUNTER — Telehealth: Payer: Self-pay | Admitting: *Deleted

## 2012-05-16 NOTE — Telephone Encounter (Signed)
Pt will come in Wed AM to receive vaccine.

## 2012-05-18 ENCOUNTER — Ambulatory Visit (INDEPENDENT_AMBULATORY_CARE_PROVIDER_SITE_OTHER): Payer: 59 | Admitting: *Deleted

## 2012-05-18 DIAGNOSIS — Z23 Encounter for immunization: Secondary | ICD-10-CM

## 2012-06-24 ENCOUNTER — Other Ambulatory Visit: Payer: Self-pay | Admitting: Internal Medicine

## 2012-06-24 NOTE — Telephone Encounter (Signed)
Needs refill

## 2012-07-14 ENCOUNTER — Telehealth: Payer: Self-pay | Admitting: Internal Medicine

## 2012-07-14 NOTE — Telephone Encounter (Signed)
Pt would like know if her medical records [place] her [place] has been received..please call pt on her cell number to advise either way.. Thanks

## 2012-07-23 ENCOUNTER — Other Ambulatory Visit: Payer: Self-pay | Admitting: Internal Medicine

## 2012-07-25 ENCOUNTER — Other Ambulatory Visit: Payer: Self-pay | Admitting: *Deleted

## 2012-07-25 NOTE — Telephone Encounter (Signed)
Refill request

## 2012-07-25 NOTE — Telephone Encounter (Signed)
Called in Elestrin ointment to [facility]

## 2012-09-27 ENCOUNTER — Other Ambulatory Visit: Payer: Self-pay | Admitting: Internal Medicine

## 2012-09-27 NOTE — Telephone Encounter (Signed)
Refill request

## 2013-03-18 ENCOUNTER — Other Ambulatory Visit: Payer: Self-pay | Admitting: Internal Medicine

## 2013-03-20 NOTE — Telephone Encounter (Signed)
Refill request  Pt will come in next week to have labs drawn CPE scheduled for Dec.

## 2013-04-24 ENCOUNTER — Other Ambulatory Visit: Payer: Self-pay | Admitting: *Deleted

## 2013-04-24 ENCOUNTER — Ambulatory Visit (HOSPITAL_BASED_OUTPATIENT_CLINIC_OR_DEPARTMENT_OTHER)
Admission: RE | Admit: 2013-04-24 | Discharge: 2013-04-24 | Disposition: A | Discharge status: Home / Self Care | Payer: BC Managed Care – PPO | Source: Ambulatory Visit | Attending: Internal Medicine | Admitting: Internal Medicine

## 2013-04-24 DIAGNOSIS — Z139 Encounter for screening, unspecified: Secondary | ICD-10-CM

## 2013-04-24 DIAGNOSIS — Z1231 Encounter for screening mammogram for malignant neoplasm of breast: Secondary | ICD-10-CM | POA: Insufficient documentation

## 2013-04-24 DIAGNOSIS — E785 Hyperlipidemia, unspecified: Secondary | ICD-10-CM

## 2013-04-24 DIAGNOSIS — Z780 Asymptomatic menopausal state: Secondary | ICD-10-CM

## 2013-04-24 LAB — COMPREHENSIVE METABOLIC PANEL
ALT: 27 U/L (ref 0–35)
AST: 23 U/L (ref 0–37)
BUN: 17 mg/dL (ref 6–23)
Calcium: 9.9 mg/dL (ref 8.4–10.5)
Chloride: 101 mEq/L (ref 96–112)
Creat: 0.74 mg/dL (ref 0.50–1.10)
Total Bilirubin: 0.6 mg/dL (ref 0.3–1.2)

## 2013-04-24 LAB — LIPID PANEL
HDL: 49 mg/dL (ref >39)
LDL Cholesterol: 133 mg/dL — ABNORMAL HIGH (ref 0–99)
Total CHOL/HDL Ratio: 4.5 Ratio
Triglycerides: 199 mg/dL — ABNORMAL HIGH (ref <150)

## 2013-04-24 LAB — CBC WITH DIFFERENTIAL/PLATELET
Basophils Absolute: 0 10*3/uL (ref 0.0–0.1)
Eosinophils Absolute: 0.2 10*3/uL (ref 0.0–0.7)
Eosinophils Relative: 4 % (ref 0–5)
HCT: 41.5 % (ref 36.0–46.0)
Lymphocytes Relative: 34 % (ref 12–46)
MCH: 30.7 pg (ref 26.0–34.0)
MCHC: 34.2 g/dL (ref 30.0–36.0)
MCV: 89.6 fL (ref 78.0–100.0)
Monocytes Absolute: 0.3 10*3/uL (ref 0.1–1.0)
Platelets: 198 10*3/uL (ref 150–400)
RDW: 13.8 % (ref 11.5–15.5)
WBC: 4.3 10*3/uL (ref 4.0–10.5)

## 2013-04-24 LAB — TSH: TSH: 0.981 u[IU]/mL (ref 0.350–4.500)

## 2013-04-26 ENCOUNTER — Telehealth: Payer: Self-pay | Admitting: *Deleted

## 2013-04-26 ENCOUNTER — Encounter: Payer: Self-pay | Admitting: *Deleted

## 2013-04-26 MED ORDER — SIMVASTATIN 10 MG PO TABS: 10.0000 mg | ORAL_TABLET | Freq: Every day | ORAL | Status: DC

## 2013-04-26 NOTE — Telephone Encounter (Signed)
Message copied by Mathews Robinsons on Wed Apr 26, 2013 11:51 AM ------      Message from: Raechel Chute D      Created: Wed Apr 26, 2013  7:43 AM       Karen Kitchens            Call pt and let her know that her cholesterol is slightly improved. To reach ideal goal she can take two tablets of her 5 mg simvastatin (Zocor) daily            If she wishes to see me in office can give OV to discuss            Advise her to keep her appt with me in December            For Triglycerides she can take OTC fish oil  Follow bottle directions            Ok to mail labs to her ------

## 2013-04-26 NOTE — Telephone Encounter (Signed)
Notified pt of results pt wishes to take 10 mg daily request RX for the 10 mg

## 2013-04-26 NOTE — Telephone Encounter (Signed)
Let pt know I sent Zocor 10 mg ot [facility]

## 2013-07-11 ENCOUNTER — Encounter (INDEPENDENT_AMBULATORY_CARE_PROVIDER_SITE_OTHER): Payer: Self-pay

## 2013-07-11 ENCOUNTER — Other Ambulatory Visit: Payer: Self-pay | Admitting: Internal Medicine

## 2013-07-11 ENCOUNTER — Encounter: Payer: Self-pay | Admitting: Internal Medicine

## 2013-07-11 ENCOUNTER — Ambulatory Visit (INDEPENDENT_AMBULATORY_CARE_PROVIDER_SITE_OTHER): Payer: BC Managed Care – PPO | Admitting: Internal Medicine

## 2013-07-11 VITALS — BP 110/69 | HR 85 | Temp 98.1°F | Resp 18

## 2013-07-11 DIAGNOSIS — N951 Menopausal and female climacteric states: Secondary | ICD-10-CM

## 2013-07-11 DIAGNOSIS — E785 Hyperlipidemia, unspecified: Secondary | ICD-10-CM

## 2013-07-11 DIAGNOSIS — Z139 Encounter for screening, unspecified: Secondary | ICD-10-CM

## 2013-07-11 DIAGNOSIS — Z780 Asymptomatic menopausal state: Secondary | ICD-10-CM

## 2013-07-11 DIAGNOSIS — Z0000 Encounter for general adult medical examination without abnormal findings: Secondary | ICD-10-CM

## 2013-07-11 DIAGNOSIS — M549 Dorsalgia, unspecified: Secondary | ICD-10-CM

## 2013-07-11 LAB — POCT URINALYSIS DIPSTICK
Bilirubin, UA: NEGATIVE
Blood, UA: NEGATIVE
Glucose, UA: NEGATIVE
Ketones, UA: NEGATIVE
Protein, UA: NEGATIVE
Spec Grav, UA: 1.01
Urobilinogen, UA: NEGATIVE
pH, UA: 7

## 2013-07-11 NOTE — Patient Instructions (Signed)
Refer to Dr. Nickola Major

## 2013-07-11 NOTE — Progress Notes (Signed)
Subjective:    Patient ID: Suzanne Harris, female    DOB: [birth year], 57 y.o.   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    She also has had a recent flare of her "bulging discs"  In her back .  Initially L/S  [place].   She does not like any invasive treatments for her back    She is off her HT and it made her migraine headaches worse.   Seh has not had any troublesome  Flushes  She is tolerating her Zocor and we increased the dose to 10 mg approx 3 months ago  No myalgias   No Known Allergies Past Medical History  Diagnosis Date  . Menopause     with vasomotor flushes  . Allergic rhinitis   . Colon polyps   . Generalized headaches     ? migraine  . Breast calcifications   . Uterine fibroid     negative biopsy 2/12  . Hyperlipidemia   . Facial palsy as birth trauma    Past Surgical History  Procedure Laterality Date  . No past surgeries     History   Social History  . Marital Status: Married    Spouse Name: [name]    Number of Children: 2  . Years of Education: college   Occupational History  . Homemaker    Social History Main Topics  . Smoking status: Never Smoker   . Smokeless tobacco: Never Used  . Alcohol Use: 0.0 oz/week    3-5 drink(s) per week  . Drug Use: No  . Sexual Activity: Yes    Partners: Male    Birth Control/ Protection: Post-menopausal   Other Topics Concern  . Not on file   Social History Narrative  . No narrative on file   Family History  Problem Relation Age of Onset  . Diabetes    . Hypertension    . Colon cancer Cousin    Patient Active Problem List   Diagnosis Date Noted  . Arthralgia 02/02/2012  . Uterus, adenomyosis 02/02/2012  . Vaginal spotting 02/02/2012  . Breast calcifications 11/19/2011  . Allergic rhinitis 10/05/2011  . Menopause  10/05/2011  . Hot flushes, perimenopausal 10/05/2011  . Colon polyps 10/05/2011  . Generalized headaches 10/05/2011  . Fibroids 10/05/2011  . Hyperlipidemia 10/05/2011  . Facial palsy as birth trauma 10/05/2011   Current Outpatient Prescriptions on File Prior to Visit  Medication Sig Dispense Refill  . cetirizine (ZYRTEC) 10 MG tablet Take 10 mg by mouth daily.        . Multiple Vitamin (MULTIVITAMIN) tablet Take 1 tablet by mouth daily.        . simvastatin (ZOCOR) 10 MG tablet Take 1 tablet (10 mg total) by mouth at bedtime.  90 tablet  3  . nabumetone (RELAFEN) 500 MG tablet Take one tablet 2 times daily with food  60 tablet  2  . progesterone (PROMETRIUM) 100 MG capsule TAKE ONE CAPSULE BY MOUTH EVERY DAY  30 capsule  5   No current facility-administered medications on file prior to visit.      Review of Systems  Musculoskeletal: Positive for arthralgias, back pain and joint swelling. Negative for gait problem, neck pain and neck stiffness.  All other systems reviewed and are negative.  Objective:   Physical Exam Physical Exam  Nursing note and vitals reviewed.  Constitutional: She is oriented to person, place, and time. She appears well-developed and well-nourished.  HENT:  Head: Normocephalic and atraumatic.  Right Ear: Tympanic membrane and ear canal normal. No drainage. Tympanic membrane is not injected and not erythematous.  Left Ear: Tympanic membrane and ear canal normal. No drainage. Tympanic membrane is not injected and not erythematous.  Nose: Nose normal. Right sinus exhibits no maxillary sinus tenderness and no frontal sinus tenderness. Left sinus exhibits no maxillary sinus tenderness and no frontal sinus tenderness.  Mouth/Throat: Oropharynx is clear and moist. No oral lesions. No oropharyngeal exudate.  Eyes: Conjunctivae and EOM are normal. Pupils are equal, round, and reactive to light.  Neck: Normal range of motion. Neck supple. No JVD present.  Carotid bruit is not present. No mass and no thyromegaly present.  Cardiovascular: Normal rate, regular rhythm, S1 normal, S2 normal and intact distal pulses. Exam reveals no gallop and no friction rub.  No murmur heard.  Pulses:  Carotid pulses are 2+ on the right side, and 2+ on the left side.  Dorsalis pedis pulses are 2+ on the right side, and 2+ on the left side.  No carotid bruit. No LE edema  Pulmonary/Chest: Breath sounds normal. She has no wheezes. She has no rales. She exhibits no tenderness.   Breast  No discrete mass no nipple discharge no axillary adenopathy bilaterally Abdominal: Soft. Bowel sounds are normal. She exhibits no distension and no mass. There is no hepatosplenomegaly. There is no tenderness. There is no CVA tenderness.  Musculoskeletal: Normal range of motion.  No active synovitis to joints.  Lymphadenopathy:  She has no cervical adenopathy.  She has no axillary adenopathy.  Right: No inguinal and no supraclavicular adenopathy present.  Left: No inguinal and no supraclavicular adenopathy present.  Neurological: She is alert and oriented to person, place, and time. She has normal strength and normal reflexes. She displays no tremor. No cranial nerve deficit or sensory deficit. Coordination and gait normal.  Skin: Skin is warm and dry. No rash noted. No cyanosis. Nails show no clubbing.  Psychiatric: She has a normal mood and affect. Her speech is normal and behavior is normal. Cognition and memory are normal.           Assessment & Plan:  Health Maintenance  UTD with vaccines and mm .    OA  Worse in hands and feet inflammatory and on prednisone for this  Hyperlipidemia  On Zocor 10 mg will recheck today  Back pain history of bulging discs.  She would like to try noninvasive care for this will refer to physiatrist  Dr. Nickola Major  Colon polyps      Migraine headaches  Continue prn meds

## 2013-07-12 LAB — COMPREHENSIVE METABOLIC PANEL
ALT: 46 U/L — ABNORMAL HIGH (ref 0–35)
Alkaline Phosphatase: 62 U/L (ref 39–117)
BUN: 14 mg/dL (ref 6–23)
CO2: 29 mEq/L (ref 19–32)
Calcium: 10.2 mg/dL (ref 8.4–10.5)
Chloride: 100 mEq/L (ref 96–112)
Creat: 0.71 mg/dL (ref 0.50–1.10)
Glucose, Bld: 95 mg/dL (ref 70–99)
Sodium: 139 mEq/L (ref 135–145)
Total Bilirubin: 0.4 mg/dL (ref 0.3–1.2)
Total Protein: 7.7 g/dL (ref 6.0–8.3)

## 2013-07-12 LAB — LIPID PANEL
Cholesterol: 249 mg/dL — ABNORMAL HIGH (ref 0–200)
HDL: 54 mg/dL (ref >39)
Total CHOL/HDL Ratio: 4.6 Ratio
Triglycerides: 244 mg/dL — ABNORMAL HIGH (ref <150)
VLDL: 49 mg/dL — ABNORMAL HIGH (ref 0–40)

## 2013-07-13 ENCOUNTER — Telehealth: Payer: Self-pay | Admitting: *Deleted

## 2013-07-13 ENCOUNTER — Encounter: Payer: Self-pay | Admitting: *Deleted

## 2013-07-13 NOTE — Telephone Encounter (Signed)
LVM TO RETURN CALL

## 2013-07-13 NOTE — Telephone Encounter (Signed)
Message copied by Mathews Robinsons on Thu Jul 13, 2013  3:54 PM ------      Message from: Raechel Chute D      Created: Wed Jul 12, 2013 12:46 PM       Syris Brookens            Call pt and let her know that her lipids are not any better on the 10 mg.  I would like to see her in office to discuss options.  Give her 30 min appt            Also ask her if she has seen a GI MD since she has been her in [place].  She is due for a colonoscopy if she has not had one already ------

## 2013-07-14 ENCOUNTER — Encounter: Payer: Self-pay | Admitting: *Deleted

## 2013-07-20 ENCOUNTER — Ambulatory Visit (INDEPENDENT_AMBULATORY_CARE_PROVIDER_SITE_OTHER): Payer: BC Managed Care – PPO | Admitting: Internal Medicine

## 2013-07-20 ENCOUNTER — Encounter: Payer: Self-pay | Admitting: Internal Medicine

## 2013-07-20 VITALS — BP 118/72 | HR 69 | Temp 97.5°F | Resp 18

## 2013-07-20 DIAGNOSIS — M533 Sacrococcygeal disorders, not elsewhere classified: Secondary | ICD-10-CM

## 2013-07-20 DIAGNOSIS — E785 Hyperlipidemia, unspecified: Secondary | ICD-10-CM

## 2013-07-20 DIAGNOSIS — M545 Low back pain: Secondary | ICD-10-CM

## 2013-07-20 MED ORDER — SIMVASTATIN 10 MG PO TABS: ORAL_TABLET | ORAL | Status: DC

## 2013-07-20 NOTE — Progress Notes (Signed)
Subjective:    Patient ID: Suzanne Harris, female    DOB: 1956-02-15, 57 y.o.   MRN: [id number]  HPI  Suzanne Harris is here for follow up of hyperlipidemia and back pain  Lipids  See recdent labs on 10 mg daily  She tells me she has 7 sibs.  4 are diabetic and on a statin.    L/S strain  Had seem physiatrist  [name]   .  She is going to try aquatic therapy.  Robaxin bid was recommended but it made her sleepy.  No Known Allergies Past Medical History  Diagnosis Date  . Menopause     with vasomotor flushes  . Allergic rhinitis   . Colon polyps   . Generalized headaches     ? migraine  . Breast calcifications   . Uterine fibroid     negative biopsy 2/12  . Hyperlipidemia   . Facial palsy as birth trauma    Past Surgical History  Procedure Laterality Date  . No past surgeries     History   Social History  . Marital Status: Married    Spouse Name: [name]    Number of Children: 2  . Years of Education: college   Occupational History  . Homemaker    Social History Main Topics  . Smoking status: Never Smoker   . Smokeless tobacco: Never Used  . Alcohol Use: 0.0 oz/week    3-5 drink(s) per week  . Drug Use: No  . Sexual Activity: Yes    Partners: Male    Birth Control/ Protection: Post-menopausal   Other Topics Concern  . Not on file   Social History Narrative  . No narrative on file   Family History  Problem Relation Age of Onset  . Diabetes    . Hypertension    . Colon cancer Cousin    Patient Active Problem List   Diagnosis Date Noted  . Back pain, lumbosacral 07/20/2013  . Arthralgia 02/02/2012  . Uterus, adenomyosis 02/02/2012  . Vaginal spotting 02/02/2012  . Breast calcifications 11/19/2011  . Allergic rhinitis 10/05/2011  . Menopause 10/05/2011  . Hot flushes, perimenopausal 10/05/2011  . Colon polyps 10/05/2011  . Generalized headaches 10/05/2011  . Fibroids 10/05/2011  . Hyperlipidemia 10/05/2011  . Facial palsy as birth trauma  10/05/2011   Current Outpatient Prescriptions on File Prior to Visit  Medication Sig Dispense Refill  . celecoxib (CELEBREX) 200 MG capsule Take 200 mg by mouth 2 (two) times daily.      . cetirizine (ZYRTEC) 10 MG tablet Take 10 mg by mouth daily.        . Multiple Vitamin (MULTIVITAMIN) tablet Take 1 tablet by mouth daily.        . nabumetone (RELAFEN) 500 MG tablet Take one tablet 2 times daily with food  60 tablet  2  . progesterone (PROMETRIUM) 100 MG capsule TAKE ONE CAPSULE BY MOUTH EVERY DAY  30 capsule  5   No current facility-administered medications on file prior to visit.      Review of Systems    see HPI Objective:   Physical Exam  Physical Exam  Nursing note and vitals reviewed.  Constitutional: She is oriented to person, place, and time. She appears well-developed and well-nourished.  HENT:  Head: Normocephalic and atraumatic.  Cardiovascular: Normal rate and regular rhythm. Exam reveals no gallop and no friction rub.  No murmur heard.  Pulmonary/Chest: Breath sounds normal. She has no wheezes. She has no rales.  Neurological:  She is alert and oriented to person, place, and time.  Skin: Skin is warm and dry.  Psychiatric: She has a normal mood and affect. Her behavior is normal.        Assessment & Plan:  Hyperlipidemia  sheis hesitant to go up to 20 mg daily.  Will slowly taper upwards to 20 mg on M,W, Fri and 10 mg other days.  She is to report any mylagias.  See min 3 months - willl check fasting lipids prior to visit.    L/S pain   OK to try Robaxin at hs only.  Will try aquatic therapy  See me in 3 months cholesterol check

## 2013-07-31 NOTE — Telephone Encounter (Signed)
Medication management

## 2013-11-06 ENCOUNTER — Ambulatory Visit (INDEPENDENT_AMBULATORY_CARE_PROVIDER_SITE_OTHER): Payer: BC Managed Care – PPO | Admitting: Internal Medicine

## 2013-11-06 ENCOUNTER — Encounter: Payer: Self-pay | Admitting: Internal Medicine

## 2013-11-06 VITALS — BP 112/70 | HR 70 | Temp 98.3°F | Resp 17 | Wt 132.0 lb

## 2013-11-06 DIAGNOSIS — E785 Hyperlipidemia, unspecified: Secondary | ICD-10-CM

## 2013-11-06 DIAGNOSIS — M533 Sacrococcygeal disorders, not elsewhere classified: Secondary | ICD-10-CM

## 2013-11-06 DIAGNOSIS — M545 Low back pain, unspecified: Secondary | ICD-10-CM

## 2013-11-06 DIAGNOSIS — N951 Menopausal and female climacteric states: Secondary | ICD-10-CM

## 2013-11-06 LAB — LIPID PANEL
Cholesterol: 189 mg/dL (ref 0–200)
HDL: 50 mg/dL (ref >39)
LDL Cholesterol: 102 mg/dL — ABNORMAL HIGH (ref 0–99)
Total CHOL/HDL Ratio: 3.8 Ratio
Triglycerides: 185 mg/dL — ABNORMAL HIGH (ref <150)
VLDL: 37 mg/dL (ref 0–40)

## 2013-11-06 LAB — COMPREHENSIVE METABOLIC PANEL
ALT: 35 U/L (ref 0–35)
AST: 28 U/L (ref 0–37)
Albumin: 4.9 g/dL (ref 3.5–5.2)
Alkaline Phosphatase: 57 U/L (ref 39–117)
BUN: 13 mg/dL (ref 6–23)
CO2: 29 mEq/L (ref 19–32)
Calcium: 9.6 mg/dL (ref 8.4–10.5)
Chloride: 102 mEq/L (ref 96–112)
Creat: 0.79 mg/dL (ref 0.50–1.10)
GLUCOSE: 107 mg/dL — AB (ref 70–99)
Potassium: 4.4 mEq/L (ref 3.5–5.3)
Sodium: 141 mEq/L (ref 135–145)
Total Bilirubin: 0.5 mg/dL (ref 0.2–1.2)
Total Protein: 7.3 g/dL (ref 6.0–8.3)

## 2013-11-06 NOTE — Patient Instructions (Signed)
See me as needed 

## 2013-11-06 NOTE — Progress Notes (Signed)
Subjective:    Patient ID: Suzanne Harris, female    DOB: 31-Mar-1956, 58 y.o.   MRN: [id number]  HPI  Melea is here for follow up of several issues. She is taking 20 mg of her Zocor and tolerating without mylagias  She has completed PT for her lower back,  She has improved and is not taking Robaxin.    Perimenopausal hot flushes: she has only a few and is off HT  No Known Allergies Past Medical History  Diagnosis Date  . Menopause     with vasomotor flushes  . Allergic rhinitis   . Colon polyps   . Generalized headaches     ? migraine  . Breast calcifications   . Uterine fibroid     negative biopsy 2/12  . Hyperlipidemia   . Facial palsy as birth trauma    Past Surgical History  Procedure Laterality Date  . No past surgeries     History   Social History  . Marital Status: Married    Spouse Name: Cornelia Copa    Number of Children: 2  . Years of Education: college   Occupational History  . Homemaker    Social History Main Topics  . Smoking status: Never Smoker   . Smokeless tobacco: Never Used  . Alcohol Use: 0.0 oz/week    3-5 drink(s) per week  . Drug Use: No  . Sexual Activity: Yes    Partners: Male    Birth Control/ Protection: Post-menopausal   Other Topics Concern  . Not on file   Social History Narrative  . No narrative on file   Family History  Problem Relation Age of Onset  . Diabetes    . Hypertension    . Colon cancer Cousin    Patient Active Problem List   Diagnosis Date Noted  . Back pain, lumbosacral 07/20/2013  . Arthralgia 02/02/2012  . Uterus, adenomyosis 02/02/2012  . Vaginal spotting 02/02/2012  . Breast calcifications 11/19/2011  . Allergic rhinitis 10/05/2011  . Menopause 10/05/2011  . Hot flushes, perimenopausal 10/05/2011  . Colon polyps 10/05/2011  . Generalized headaches 10/05/2011  . Fibroids 10/05/2011  . Hyperlipidemia 10/05/2011  . Facial palsy as birth trauma 10/05/2011   Current Outpatient Prescriptions on  File Prior to Visit  Medication Sig Dispense Refill  . cetirizine (ZYRTEC) 10 MG tablet Take 10 mg by mouth daily.        . Multiple Vitamin (MULTIVITAMIN) tablet Take 1 tablet by mouth daily.        . simvastatin (ZOCOR) 10 MG tablet Take one tablet on Tues, Thurs , Sat, Sunday and 2 tablets on M,W,Fri  90 tablet  3   No current facility-administered medications on file prior to visit.        Review of Systems See HPI    Objective:   Physical Exam Physical Exam  Nursing note and vitals reviewed.  Constitutional: She is oriented to person, place, and time. She appears well-developed and well-nourished.  HENT:  Head: Normocephalic and atraumatic.  Cardiovascular: Normal rate and regular rhythm. Exam reveals no gallop and no friction rub.  No murmur heard.  Pulmonary/Chest: Breath sounds normal. She has no wheezes. She has no rales.  Neurological: She is alert and oriented to person, place, and time.  Skin: Skin is warm and dry.  Psychiatric: She has a normal mood and affect. Her behavior is normal.          Assessment & Plan:  Hyperlipidemia  On Zocor  20 mg daily  Will check lipid liver today  L/S back pain  Finished PT   Some improvement  Perimenopausal hot flushes off HT now  Se eme as needed

## 2013-11-08 ENCOUNTER — Encounter: Payer: Self-pay | Admitting: Internal Medicine

## 2014-02-22 ENCOUNTER — Other Ambulatory Visit: Payer: Self-pay | Admitting: Internal Medicine

## 2014-02-22 NOTE — Telephone Encounter (Signed)
Requested Medications     Medication name:  Name from pharmacy:  simvastatin (ZOCOR) 10 MG tablet  SIMVASTATIN 10 MG TABLET    Sig: TAKE ONE TABLET ON TUES, THURS , SAT, SUNDAY AND 2 TABLETS ON M,W,FRI    Dispense: 90 tablet Refills: 3 Start: 02/22/2014  Class: Normal    Requested on: 07/20/2013    Originally ordered on: 04/26/2013 Last refill: 01/03/2014 Order History and Details

## 2014-03-29 IMAGING — US US PELVIS COMPLETE
1 series · 13 of 25 positions shown · non-contrast
Comparison: September 23, 2010

CLINICAL DATA: Dysfunctional uterine bleeding

TRANSABDOMINAL AND TRANSVAGINAL ULTRASOUND OF PELVIS
TECHNIQUE: Both transabdominal and transvaginal ultrasound
examinations of the pelvis were performed. Transabdominal technique
was performed for global imaging of the pelvis including uterus,
ovaries, adnexal regions, and pelvic cul-de-sac.

[Series 1: us pelvis complete · 0.35mm/px · 13 of 76 slices shown]
[im 1/76]
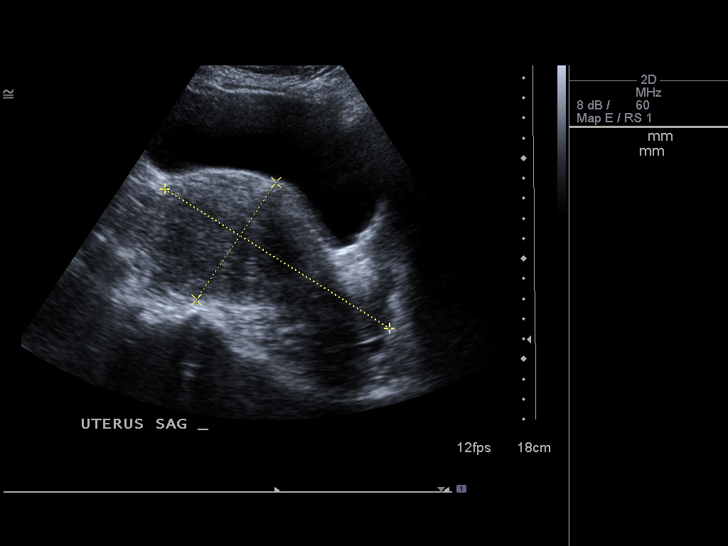
[im 7/76]
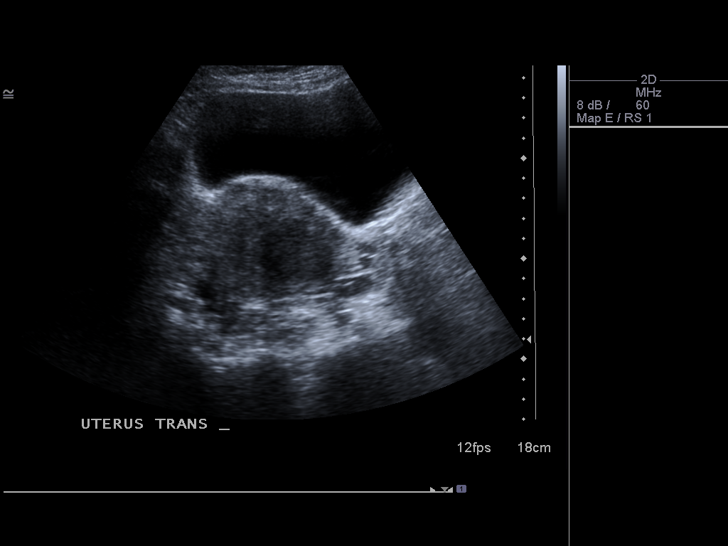
[im 13/76]
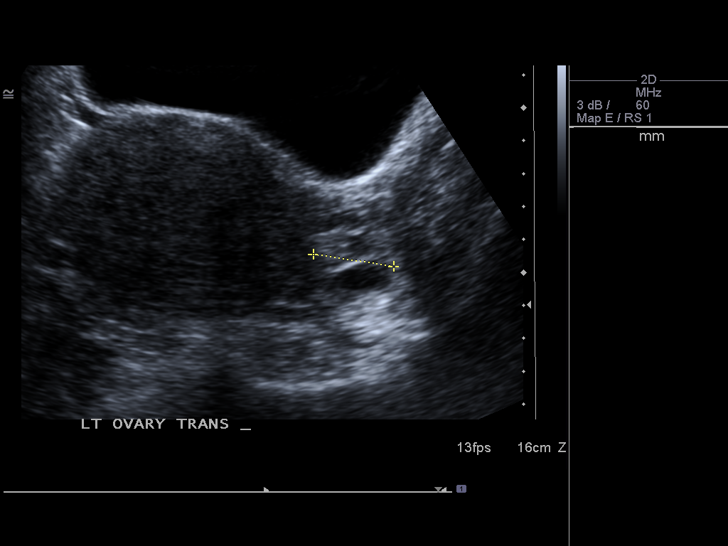
[im 19/76]
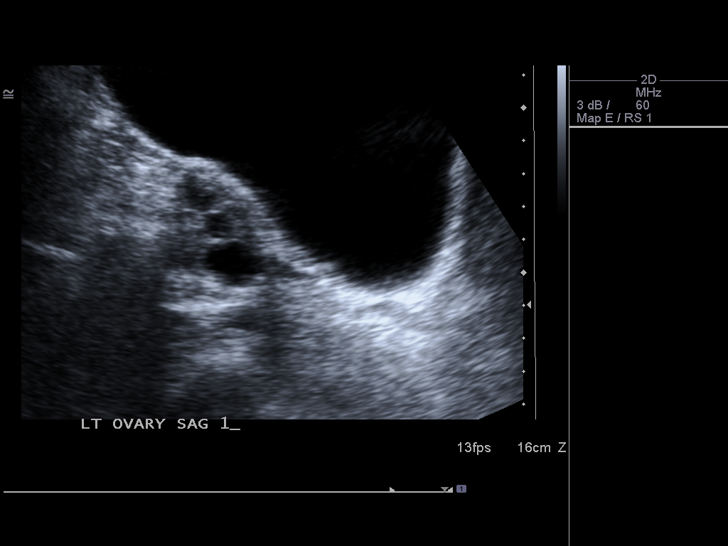
[im 26/76]
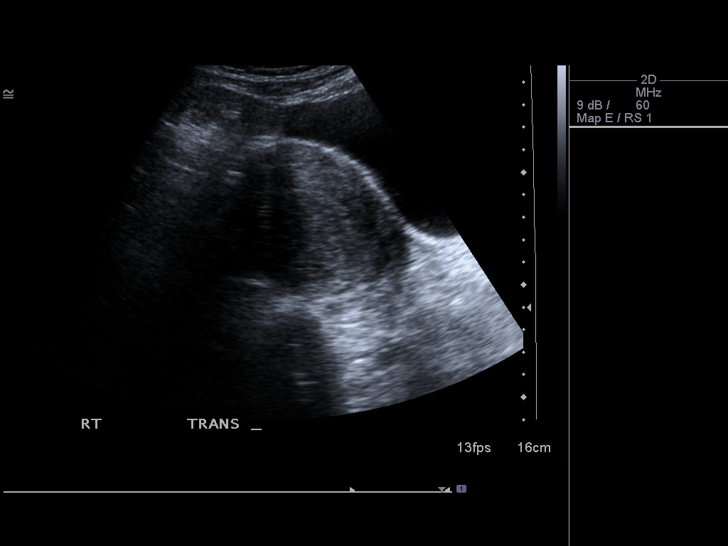
[im 32/76]
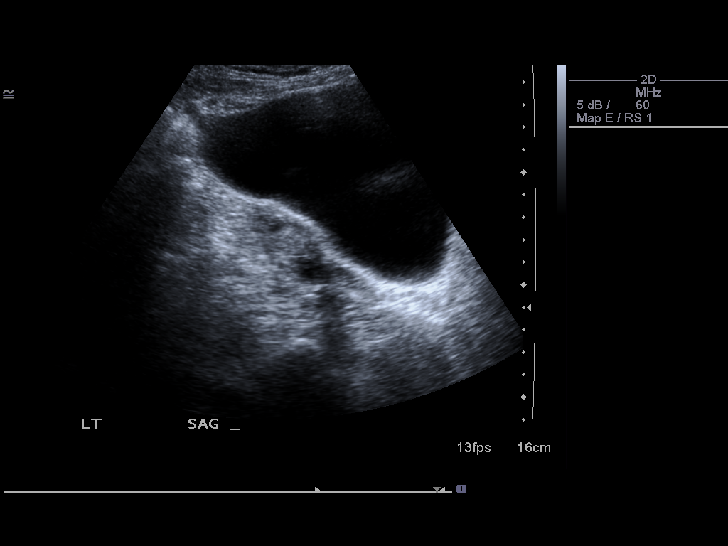
[im 38/76]
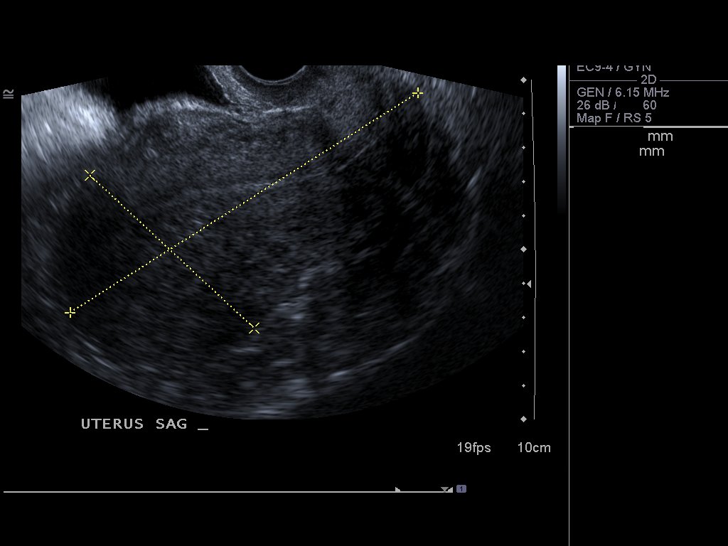
[im 44/76]
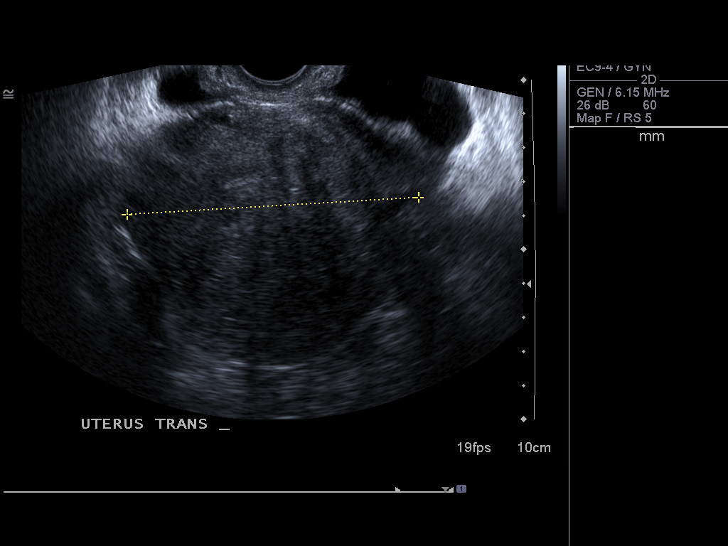
[im 51/76]
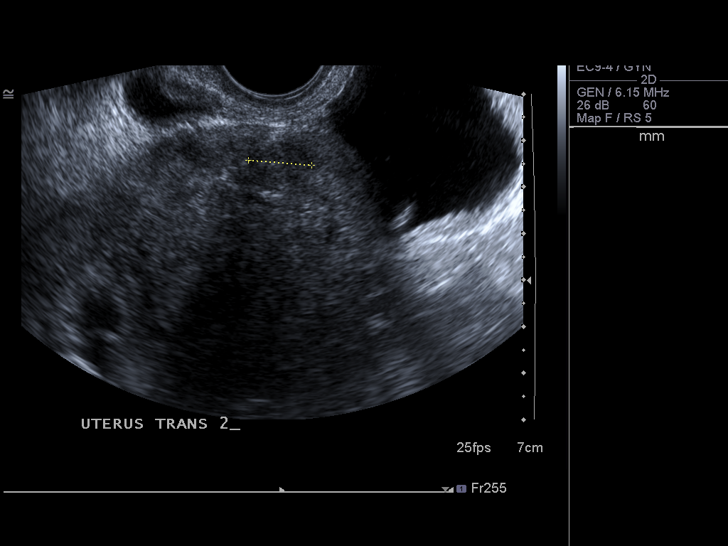
[im 57/76]
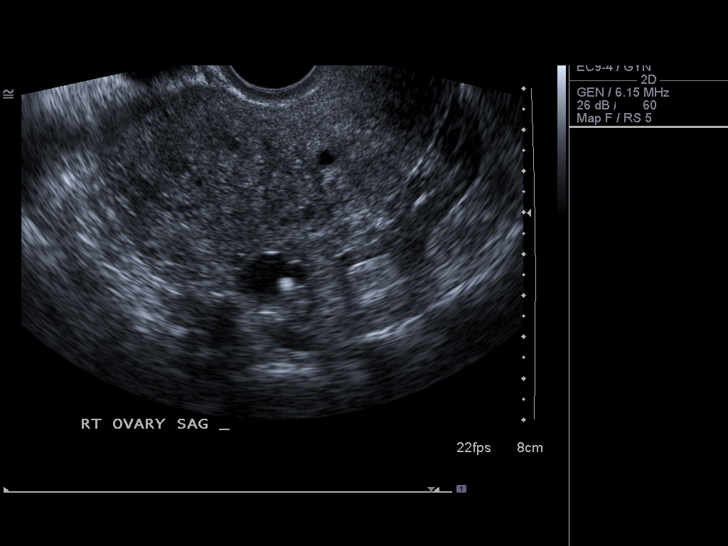
[im 63/76]
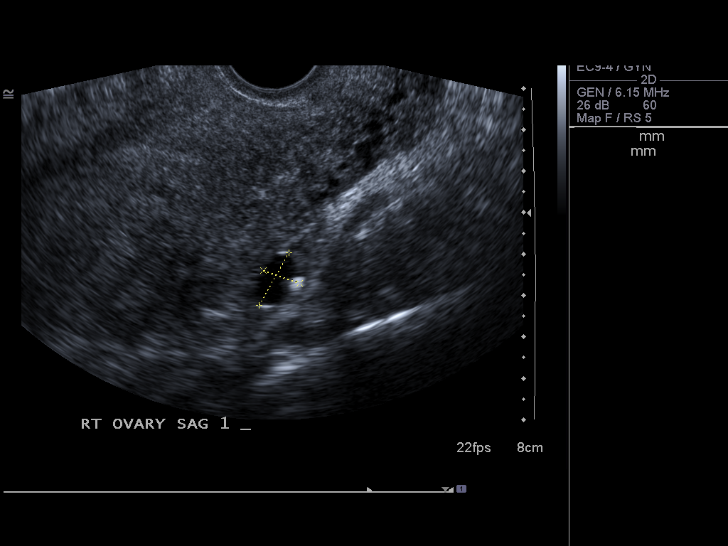
[im 69/76]
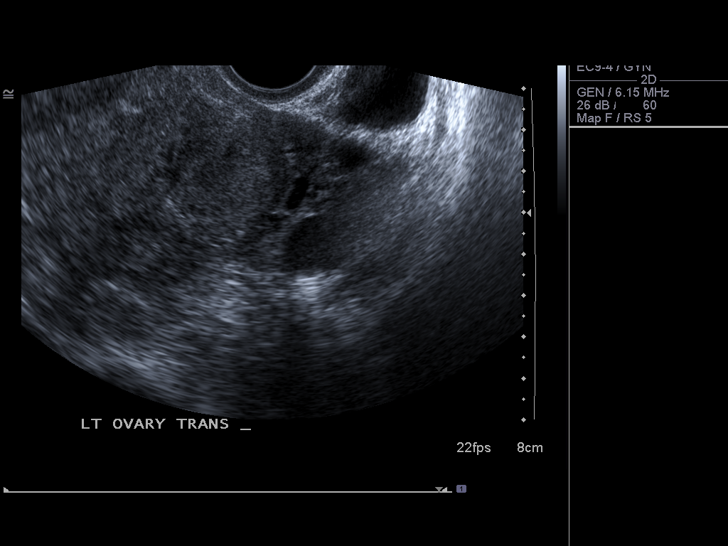
[im 76/76]
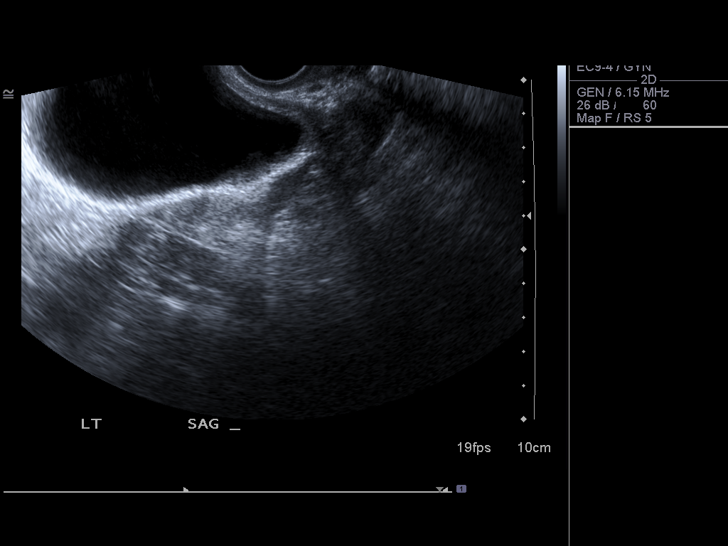

[13 of 25 positions shown; findings below may reference images not displayed]

It was necessary to proceed with endovaginal exam following the
transabdominal exam to visualize the endometrium.
FINDINGS: The uterus is mildly enlarged and lobular in contour, measuring
13.2 x 7.1 x 7.2 cm (previously 10.9 x 5.4 x 6.4 cm.) Multiple
myometrial fibroids are again noted.  The largest measures 3.4 cm
at the left body, unchanged.  Diffuse myometrial heterogeneity also
indicates an element of adenomyosis.  The endometrial stripe is
thin and homogeneous, measuring 3 mm in width.

Both ovaries have a normal size and appearance.  The right ovary
measures 2.9 x 1.5 x 2.1 cm, and the left ovary measures 3.7 x
x 2.4 cm.  There are no adnexal masses or free pelvic fluid.
IMPRESSION: The uterus is enlarged, lobular, and heterogeneous,
consistent with a combination of fibroids and adenomyosis.  The
overall size has slightly increased but the largest fibroid is
stable in size.

## 2014-05-16 ENCOUNTER — Other Ambulatory Visit: Payer: Self-pay | Admitting: *Deleted

## 2014-05-16 NOTE — Telephone Encounter (Signed)
Patient is requesting the dose be changed to 20mg  QD because her insurance says she keeps refilling it too soon because the #90 doesn't last long enough.-eh

## 2014-05-18 MED ORDER — SIMVASTATIN 10 MG PO TABS: ORAL_TABLET | ORAL | Status: DC

## 2014-06-04 ENCOUNTER — Encounter: Payer: Self-pay | Admitting: Internal Medicine

## 2014-06-07 ENCOUNTER — Encounter: Payer: Self-pay | Admitting: *Deleted

## 2014-07-13 ENCOUNTER — Other Ambulatory Visit: Payer: Self-pay | Admitting: *Deleted

## 2014-07-13 DIAGNOSIS — Z0000 Encounter for general adult medical examination without abnormal findings: Secondary | ICD-10-CM

## 2014-07-13 DIAGNOSIS — E785 Hyperlipidemia, unspecified: Secondary | ICD-10-CM

## 2014-07-16 ENCOUNTER — Ambulatory Visit (INDEPENDENT_AMBULATORY_CARE_PROVIDER_SITE_OTHER): Payer: BC Managed Care – PPO | Admitting: Internal Medicine

## 2014-07-16 ENCOUNTER — Encounter: Payer: Self-pay | Admitting: *Deleted

## 2014-07-16 ENCOUNTER — Other Ambulatory Visit: Payer: Self-pay | Admitting: Internal Medicine

## 2014-07-16 ENCOUNTER — Encounter: Payer: Self-pay | Admitting: Internal Medicine

## 2014-07-16 VITALS — BP 113/71 | HR 73 | Resp 16 | Ht 60.75 in | Wt 128.0 lb

## 2014-07-16 DIAGNOSIS — Z1231 Encounter for screening mammogram for malignant neoplasm of breast: Secondary | ICD-10-CM

## 2014-07-16 DIAGNOSIS — M5489 Other dorsalgia: Secondary | ICD-10-CM

## 2014-07-16 DIAGNOSIS — K635 Polyp of colon: Secondary | ICD-10-CM | POA: Diagnosis not present

## 2014-07-16 DIAGNOSIS — E785 Hyperlipidemia, unspecified: Secondary | ICD-10-CM | POA: Diagnosis not present

## 2014-07-16 DIAGNOSIS — Z0189 Encounter for other specified special examinations: Secondary | ICD-10-CM | POA: Diagnosis not present

## 2014-07-16 DIAGNOSIS — Z23 Encounter for immunization: Secondary | ICD-10-CM | POA: Diagnosis not present

## 2014-07-16 DIAGNOSIS — Z0000 Encounter for general adult medical examination without abnormal findings: Secondary | ICD-10-CM

## 2014-07-16 LAB — CBC WITH DIFFERENTIAL/PLATELET
Basophils Absolute: 0 10*3/uL (ref 0.0–0.1)
Basophils Relative: 1 % (ref 0–1)
Eosinophils Absolute: 0.2 10*3/uL (ref 0.0–0.7)
Eosinophils Relative: 4 % (ref 0–5)
HCT: 42.5 % (ref 36.0–46.0)
HEMOGLOBIN: 14.3 g/dL (ref 12.0–15.0)
LYMPHS ABS: 1.4 10*3/uL (ref 0.7–4.0)
LYMPHS PCT: 30 % (ref 12–46)
MCH: 30.4 pg (ref 26.0–34.0)
MCHC: 33.6 g/dL (ref 30.0–36.0)
MCV: 90.4 fL (ref 78.0–100.0)
MPV: 9.9 fL (ref 9.4–12.4)
Monocytes Absolute: 0.4 10*3/uL (ref 0.1–1.0)
Monocytes Relative: 8 % (ref 3–12)
NEUTROS PCT: 57 % (ref 43–77)
Neutro Abs: 2.6 10*3/uL (ref 1.7–7.7)
Platelets: 185 10*3/uL (ref 150–400)
RBC: 4.7 MIL/uL (ref 3.87–5.11)
RDW: 13.3 % (ref 11.5–15.5)
WBC: 4.5 10*3/uL (ref 4.0–10.5)

## 2014-07-16 LAB — POCT URINALYSIS DIPSTICK
Bilirubin, UA: NEGATIVE
Blood, UA: NEGATIVE
Glucose, UA: NEGATIVE
Ketones, UA: NEGATIVE
Leukocytes, UA: NEGATIVE
Nitrite, UA: NEGATIVE
Protein, UA: NEGATIVE
Spec Grav, UA: 1.015
Urobilinogen, UA: NEGATIVE
pH, UA: 6.5

## 2014-07-16 LAB — HEMOCCULT GUIAC POC 1CARD (OFFICE): FECAL OCCULT BLD: NEGATIVE

## 2014-07-16 LAB — COMPLETE METABOLIC PANEL WITHOUT GFR
ALT: 32 U/L (ref 0–35)
AST: 25 U/L (ref 0–37)
Albumin: 4.8 g/dL (ref 3.5–5.2)
Alkaline Phosphatase: 59 U/L (ref 39–117)
BUN: 16 mg/dL (ref 6–23)
CO2: 28 meq/L (ref 19–32)
Calcium: 9.9 mg/dL (ref 8.4–10.5)
Chloride: 101 meq/L (ref 96–112)
Creat: 0.74 mg/dL (ref 0.50–1.10)
GFR, Est African American: >89 mL/min
GFR, Est Non African American: >89 mL/min
Glucose, Bld: 102 mg/dL — ABNORMAL HIGH (ref 70–99)
Potassium: 4.1 meq/L (ref 3.5–5.3)
Sodium: 139 meq/L (ref 135–145)
Total Bilirubin: 0.7 mg/dL (ref 0.2–1.2)
Total Protein: 7.4 g/dL (ref 6.0–8.3)

## 2014-07-16 LAB — LIPID PANEL
Cholesterol: 198 mg/dL (ref 0–200)
HDL: 53 mg/dL (ref >39)
LDL CALC: 93 mg/dL (ref 0–99)
Total CHOL/HDL Ratio: 3.7 Ratio
Triglycerides: 259 mg/dL — ABNORMAL HIGH (ref <150)
VLDL: 52 mg/dL — AB (ref 0–40)

## 2014-07-16 LAB — TSH: TSH: 0.711 u[IU]/mL (ref 0.350–4.500)

## 2014-07-16 NOTE — Progress Notes (Signed)
Subjective:    Patient ID: Suzanne Harris, female    DOB: 1956/06/25, 58 y.o.   MRN: [id number]  HPI 11/2013  Hyperlipidemia On Zocor 20 mg daily Will check lipid liver today  L/S back pain Finished PT Some improvement  Perimenopausal hot flushes off HT now  Se eme as needed  TODAY    Minh is here for CPE  HM  She is due for mm,  Will give flu vaccine today   She needs colonoscopy she is a non- smoker  Hyperlipidemia tolerating statin fine   No Known Allergies Past Medical History  Diagnosis Date  . Menopause     with vasomotor flushes  . Allergic rhinitis   . Colon polyps   . Generalized headaches     ? migraine  . Breast calcifications   . Uterine fibroid     negative biopsy 2/12  . Hyperlipidemia   . Facial palsy as birth trauma    Past Surgical History  Procedure Laterality Date  . No past surgeries     History   Social History  . Marital Status: Married    Spouse Name: Cornelia Copa    Number of Children: 2  . Years of Education: college   Occupational History  . Homemaker    Social History Main Topics  . Smoking status: Never Smoker   . Smokeless tobacco: Never Used  . Alcohol Use: 0.0 oz/week    3-5 drink(s) per week  . Drug Use: No  . Sexual Activity:    Partners: Male    Birth Control/ Protection: Post-menopausal   Other Topics Concern  . Not on file   Social History Narrative   Family History  Problem Relation Age of Onset  . Diabetes    . Hypertension    . Colon cancer Cousin    Patient Active Problem List   Diagnosis Date Noted  . Back pain, lumbosacral 07/20/2013  . Arthralgia 02/02/2012  . Uterus, adenomyosis 02/02/2012  . Vaginal spotting 02/02/2012  . Breast calcifications 11/19/2011  . Allergic rhinitis 10/05/2011  . Menopause 10/05/2011  . Hot flushes, perimenopausal 10/05/2011  . Colon polyps 10/05/2011  . Generalized headaches 10/05/2011  . Fibroids 10/05/2011  . Hyperlipidemia 10/05/2011  . Facial palsy as  birth trauma 10/05/2011   Current Outpatient Prescriptions on File Prior to Visit  Medication Sig Dispense Refill  . cetirizine (ZYRTEC) 10 MG tablet Take 10 mg by mouth daily.      . Multiple Vitamin (MULTIVITAMIN) tablet Take 1 tablet by mouth daily.      . simvastatin (ZOCOR) 10 MG tablet TAKE ONE TABLET ON TUES, THURS , SAT, SUNDAY AND 2 TABLETS ON M,W,FRI 90 tablet 1   No current facility-administered medications on file prior to visit.       Review of Systems See HPI    Objective:   Physical Exam Physical Exam  Nursing note and vitals reviewed.  Constitutional: She is oriented to person, place, and time. She appears well-developed and well-nourished.  HENT:  Head: Normocephalic and atraumatic.  Right Ear: Tympanic membrane and ear canal normal. No drainage. Tympanic membrane is not injected and not erythematous.  Left Ear: Tympanic membrane and ear canal normal. No drainage. Tympanic membrane is not injected and not erythematous.  Nose: Nose normal. Right sinus exhibits no maxillary sinus tenderness and no frontal sinus tenderness. Left sinus exhibits no maxillary sinus tenderness and no frontal sinus tenderness.  Mouth/Throat: Oropharynx is clear and moist. No oral lesions. No  oropharyngeal exudate.  Eyes: Conjunctivae and EOM are normal. Pupils are equal, round, and reactive to light.  Neck: Normal range of motion. Neck supple. No JVD present. Carotid bruit is not present. No mass and no thyromegaly present.  Cardiovascular: Normal rate, regular rhythm, S1 normal, S2 normal and intact distal pulses. Exam reveals no gallop and no friction rub.  No murmur heard.  Pulses:  Carotid pulses are 2+ on the right side, and 2+ on the left side.  Dorsalis pedis pulses are 2+ on the right side, and 2+ on the left side.  No carotid bruit. No LE edema  Pulmonary/Chest: Breath sounds normal. She has no wheezes. She has no rales. She exhibits no tenderness. Breast no discrete mass no  nipple discharge no axillary adenopathy bilaterally  Abdominal: Soft. Bowel sounds are normal. She exhibits no distension and no mass. There is no hepatosplenomegaly. There is no tenderness. There is no CVA tenderness.  Musculoskeletal: Normal range of motion.  No active synovitis to joints.  Lymphadenopathy:  She has no cervical adenopathy.  She has no axillary adenopathy.  Right: No inguinal and no supraclavicular adenopathy present.  Left: No inguinal and no supraclavicular adenopathy present.  Neurological: She is alert and oriented to person, place, and time. She has normal strength and normal reflexes. She displays no tremor. No cranial nerve deficit or sensory deficit. Coordination and gait normal.  Skin: Skin is warm and dry. No rash noted. No cyanosis. Nails show no clubbing.  Psychiatric: She has a normal mood and affect. Her speech is normal and behavior is normal. Cognition and memory are normal.          Assessment & Plan:  HM:    Flu vaccine today  Will set up 3D mm,  Will refer to Dr. Collene Mares  Pt is a non-smoker  Hyperlipidemia continue statin   Colon polyp  See above  L/S spine discomfort   Sees Dr. Owens Shark chiropracter and this is helping  Headache:   OTC med of choice

## 2014-07-16 NOTE — Patient Instructions (Addendum)
Will set up GI referral to Dr. Collene Mares    Set up 3D mm at breast center    To lab today

## 2014-07-17 ENCOUNTER — Encounter: Payer: Self-pay | Admitting: Internal Medicine

## 2014-07-17 LAB — VITAMIN D 25 HYDROXY (VIT D DEFICIENCY, FRACTURES): Vit D, 25-Hydroxy: 26 ng/mL — ABNORMAL LOW (ref 30–100)

## 2014-08-02 ENCOUNTER — Ambulatory Visit
Admission: RE | Admit: 2014-08-02 | Discharge: 2014-08-02 | Disposition: A | Discharge status: Home / Self Care | Payer: BC Managed Care – PPO | Source: Ambulatory Visit | Attending: Internal Medicine | Admitting: Internal Medicine

## 2014-08-02 DIAGNOSIS — Z1231 Encounter for screening mammogram for malignant neoplasm of breast: Secondary | ICD-10-CM

## 2014-09-10 ENCOUNTER — Other Ambulatory Visit: Payer: Self-pay | Admitting: Internal Medicine

## 2014-09-10 NOTE — Telephone Encounter (Signed)
Refill request

## 2014-12-04 ENCOUNTER — Other Ambulatory Visit: Payer: Self-pay | Admitting: *Deleted

## 2014-12-04 MED ORDER — SIMVASTATIN 10 MG PO TABS: ORAL_TABLET | ORAL | Status: DC

## 2015-03-12 ENCOUNTER — Ambulatory Visit (INDEPENDENT_AMBULATORY_CARE_PROVIDER_SITE_OTHER): Payer: PRIVATE HEALTH INSURANCE | Admitting: Osteopathic Medicine

## 2015-03-12 ENCOUNTER — Encounter: Payer: Self-pay | Admitting: Osteopathic Medicine

## 2015-03-12 VITALS — BP 133/82 | HR 64 | Ht 61.0 in | Wt 130.0 lb

## 2015-03-12 DIAGNOSIS — E559 Vitamin D deficiency, unspecified: Secondary | ICD-10-CM | POA: Diagnosis not present

## 2015-03-12 DIAGNOSIS — Z5181 Encounter for therapeutic drug level monitoring: Secondary | ICD-10-CM

## 2015-03-12 DIAGNOSIS — R748 Abnormal levels of other serum enzymes: Secondary | ICD-10-CM

## 2015-03-12 DIAGNOSIS — E785 Hyperlipidemia, unspecified: Secondary | ICD-10-CM | POA: Diagnosis not present

## 2015-03-12 DIAGNOSIS — Z1159 Encounter for screening for other viral diseases: Secondary | ICD-10-CM

## 2015-03-12 DIAGNOSIS — Z114 Encounter for screening for human immunodeficiency virus [HIV]: Secondary | ICD-10-CM

## 2015-03-12 DIAGNOSIS — R740 Nonspecific elevation of levels of transaminase and lactic acid dehydrogenase [LDH]: Secondary | ICD-10-CM | POA: Diagnosis not present

## 2015-03-12 DIAGNOSIS — Z131 Encounter for screening for diabetes mellitus: Secondary | ICD-10-CM

## 2015-03-12 LAB — CMP AND LIVER
ALK PHOS: 60 U/L (ref 33–130)
ALT: 50 U/L — ABNORMAL HIGH (ref 6–29)
AST: 34 U/L (ref 10–35)
Albumin: 4.7 g/dL (ref 3.6–5.1)
BUN: 16 mg/dL (ref 7–25)
Bilirubin, Direct: 0.1 mg/dL (ref <=0.2)
CO2: 29 mmol/L (ref 20–31)
Calcium: 9.4 mg/dL (ref 8.6–10.4)
Chloride: 104 mmol/L (ref 98–110)
Creat: 0.8 mg/dL (ref 0.50–1.05)
GLUCOSE: 106 mg/dL — AB (ref 65–99)
Indirect Bilirubin: 0.5 mg/dL (ref 0.2–1.2)
POTASSIUM: 4.2 mmol/L (ref 3.5–5.3)
Sodium: 142 mmol/L (ref 135–146)
TOTAL PROTEIN: 7.2 g/dL (ref 6.1–8.1)
Total Bilirubin: 0.6 mg/dL (ref 0.2–1.2)

## 2015-03-12 LAB — LIPID PANEL
Cholesterol: 176 mg/dL (ref 125–200)
HDL: 55 mg/dL (ref >=46)
LDL Cholesterol: 95 mg/dL (ref <130)
Total CHOL/HDL Ratio: 3.2 Ratio (ref <=5.0)
Triglycerides: 132 mg/dL (ref <150)
VLDL: 26 mg/dL (ref <30)

## 2015-03-12 MED ORDER — SIMVASTATIN 10 MG PO TABS: ORAL_TABLET | ORAL | Status: AC

## 2015-03-12 NOTE — Progress Notes (Signed)
Subjective:    Patient ID: Suzanne Harris, female    DOB: Aug 19, 1955, 59 y.o.   MRN: [id number]  HPI New patient here to establish care, her previous physician retired. She requests testing for cholesterol levels today as well as refill on her simvastatin. Otherwise the patient has no complaints.   Preventative care reviewed as below, of note on lab review patient had low vitamin D and she is due for HIV and hepatitis C testing as per standard of care however she declines these 3 labs and would like only to do lipids and CMP for disease and medication monitoring.    Review of Systems  Constitutional: Negative for fever, chills, fatigue and unexpected weight change.  HENT:       No difficulty hearing/ringing in ears. No hay fever/allergies  Eyes: Negative for visual disturbance.       No changes in vision  Respiratory: Negative for cough and wheezing.   Cardiovascular: Negative for chest pain and palpitations.  Gastrointestinal: Negative for nausea, vomiting, diarrhea and blood in stool.  Genitourinary: Negative for enuresis.       No incontinence. No unusual genital discharge/bleeding  Musculoskeletal: Negative for myalgias and arthralgias.  Skin: Negative for rash.       No new or changed moles.  Neurological: Negative for weakness and headaches.       No memory problems.   Hematological: Negative for adenopathy. Does not bruise/bleed easily.  Psychiatric/Behavioral: Negative for sleep disturbance.       No depression or anxiety       Objective:   Physical Exam  Constitutional: She is oriented to person, place, and time. She appears well-developed and well-nourished. No distress.  HENT:  Head: Normocephalic and atraumatic.  Eyes: Conjunctivae are normal. Right eye exhibits no discharge. Left eye exhibits no discharge. No scleral icterus.  Neck: Normal range of motion. No thyromegaly present.  Cardiovascular: Normal rate, regular rhythm and normal heart sounds.   No  murmur heard. Pulmonary/Chest: Effort normal and breath sounds normal. No respiratory distress.  Abdominal: Soft. Bowel sounds are normal. There is no tenderness.  Musculoskeletal: Normal range of motion.  Neurological: She is alert and oriented to person, place, and time. No cranial nerve deficit.  Skin: Skin is warm and dry.  Psychiatric: She has a normal mood and affect. Her behavior is normal.          Assessment & Plan:   Assessments: Hyperlipidemia Hypovitaminosis D Medication monitoring HIV screening due Hepatitis C screening due  Plan: We'll check lipid levels and CMP today to monitor hyperlipidemia and her medication 50. As noted patient is due for screening for HIV and hepatitis C and she is advised to have a repeat vitamin D level however she declines these thoughts at this time. She is advised to come back in 6 months for annual wellness visit. Preventative care reviewed as below      [place] SCREENING/COUNSELING Tobacco - NONE Alcohol - OCCASIONAL/SOCIAL Diet - WATCH CALORIES Exercise - STRETCHING AND SOME WALKING/HIKING Sexual Health/STI - NO CONCERNS Depression - NONE Domestic violence - NO CONCERNS HTN - NORMAL BP Vaccination status - UTD PER PATIENT  INFECTIOUS DISEASE SCREENING HIV - all adults 15-65 - NOT TESTED GC/CT - sexually active - NOT TESTED HepC - born 73-1965 - MAY HAVE BEEN TESTED NOT SURE  ORGANIC DISEASE SCREENING Lipid - age 16 if risk - DUE  DM2 - overweight age 76-70 or other risk factors - DUE  Osteoporosis - age 68+ or one sooner if risk - NOT DUE  CANCER SCREENING Cervical - Pap q3 yr age 27+, Pap + HPV q5y age 76+ - LAST DONE 2 YEARS AGO Breast - Mammo age 51+ (C) and biennial age 38-75 (A) - LAST DONE 07/2014 NORMAL PER PATIENT Lung - low dose CT Chest age 82-80 - NOT NEEDED Colon - age 83+ or 59 years of age prior to [place] Dx - DONE 09/2014 NORMAL PER PATIENT  VACCINATION Influenza - annual - GETS  EVERY YEAR HPV - age <26yo Zoster - age 66+ - HAS HAD THIS Pneumonia - age 17+ sooner if risk (DM, other)   OTHER Fall - exercise and Vit D age 74+ Consider ASA - age 85-59

## 2015-03-12 NOTE — Patient Instructions (Signed)
Preventive Care for Adults A healthy lifestyle and preventive care can promote health and wellness. Preventive health guidelines for women include the following key practices.  A routine yearly physical is a good way to check with your health care provider about your health and preventive screening. It is a chance to share any concerns and updates on your health and to receive a thorough exam.  Visit your dentist for a routine exam and preventive care every 6 months. Brush your teeth twice a day and floss once a day. Good oral hygiene prevents tooth decay and gum disease.  The frequency of eye exams is based on your age, health, family medical history, use of contact lenses, and other factors. Follow your health care provider's recommendations for frequency of eye exams.  Eat a healthy diet. Foods like vegetables, fruits, whole grains, low-fat dairy products, and lean protein foods contain the nutrients you need without too many calories. Decrease your intake of foods high in solid fats, added sugars, and salt. Eat the right amount of calories for you.Get information about a proper diet from your health care provider, if necessary.  Regular physical exercise is one of the most important things you can do for your health. Most adults should get at least 150 minutes of moderate-intensity exercise (any activity that increases your heart rate and causes you to sweat) each week. In addition, most adults need muscle-strengthening exercises on 2 or more days a week.  Maintain a healthy weight. The body mass index (BMI) is a screening tool to identify possible weight problems. It provides an estimate of body fat based on height and weight. Your health care provider can find your BMI and can help you achieve or maintain a healthy weight.For adults 20 years and older:  A BMI below 18.5 is considered underweight.  A BMI of 18.5 to 24.9 is normal.  A BMI of 25 to 29.9 is considered overweight.  A BMI of  30 and above is considered obese.  Maintain normal blood lipids and cholesterol levels by exercising and minimizing your intake of saturated fat. Eat a balanced diet with plenty of fruit and vegetables. Blood tests for lipids and cholesterol should begin at age 76 and be repeated every 5 years. If your lipid or cholesterol levels are high, you are over 50, or you are at high risk for heart disease, you may need your cholesterol levels checked more frequently.Ongoing high lipid and cholesterol levels should be treated with medicines if diet and exercise are not working.  If you smoke, find out from your health care provider how to quit. If you do not use tobacco, do not start.  Lung cancer screening is recommended for adults aged 22-80 years who are at high risk for developing lung cancer because of a history of smoking. A yearly low-dose CT scan of the lungs is recommended for people who have at least a 30-pack-year history of smoking and are a current smoker or have quit within the past 15 years. A pack year of smoking is smoking an average of 1 pack of cigarettes a day for 1 year (for example: 1 pack a day for 30 years or 2 packs a day for 15 years). Yearly screening should continue until the smoker has stopped smoking for at least 15 years. Yearly screening should be stopped for people who develop a health problem that would prevent them from having lung cancer treatment.  If you are pregnant, do not drink alcohol. If you are breastfeeding,  be very cautious about drinking alcohol. If you are not pregnant and choose to drink alcohol, do not have more than 1 drink per day. One drink is considered to be 12 ounces (355 mL) of beer, 5 ounces (148 mL) of wine, or 1.5 ounces (44 mL) of liquor.  Avoid use of street drugs. Do not share needles with anyone. Ask for help if you need support or instructions about stopping the use of drugs.  High blood pressure causes heart disease and increases the risk of  stroke. Your blood pressure should be checked at least every 1 to 2 years. Ongoing high blood pressure should be treated with medicines if weight loss and exercise do not work.  If you are 75-52 years old, ask your health care provider if you should take aspirin to prevent strokes.  Diabetes screening involves taking a blood sample to check your fasting blood sugar level. This should be done once every 3 years, after age 15, if you are within normal weight and without risk factors for diabetes. Testing should be considered at a younger age or be carried out more frequently if you are overweight and have at least 1 risk factor for diabetes.  Breast cancer screening is essential preventive care for women. You should practice "breast self-awareness." This means understanding the normal appearance and feel of your breasts and may include breast self-examination. Any changes detected, no matter how small, should be reported to a health care provider. Women in their 58s and 30s should have a clinical breast exam (CBE) by a health care provider as part of a regular health exam every 1 to 3 years. After age 16, women should have a CBE every year. Starting at age 53, women should consider having a mammogram (breast X-ray test) every year. Women who have a family history of breast cancer should talk to their health care provider about genetic screening. Women at a high risk of breast cancer should talk to their health care providers about having an MRI and a mammogram every year.  Breast cancer gene (BRCA)-related cancer risk assessment is recommended for women who have family members with BRCA-related cancers. BRCA-related cancers include breast, ovarian, tubal, and peritoneal cancers. Having family members with these cancers may be associated with an increased risk for harmful changes (mutations) in the breast cancer genes BRCA1 and BRCA2. Results of the assessment will determine the need for genetic counseling and  BRCA1 and BRCA2 testing.  Routine pelvic exams to screen for cancer are no longer recommended for nonpregnant women who are considered low risk for cancer of the pelvic organs (ovaries, uterus, and vagina) and who do not have symptoms. Ask your health care provider if a screening pelvic exam is right for you.  If you have had past treatment for cervical cancer or a condition that could lead to cancer, you need Pap tests and screening for cancer for at least 20 years after your treatment. If Pap tests have been discontinued, your risk factors (such as having a new sexual partner) need to be reassessed to determine if screening should be resumed. Some women have medical problems that increase the chance of getting cervical cancer. In these cases, your health care provider may recommend more frequent screening and Pap tests.  The HPV test is an additional test that may be used for cervical cancer screening. The HPV test looks for the virus that can cause the cell changes on the cervix. The cells collected during the Pap test can be  tested for HPV. The HPV test could be used to screen women aged 30 years and older, and should be used in women of any age who have unclear Pap test results. After the age of 30, women should have HPV testing at the same frequency as a Pap test.  Colorectal cancer can be detected and often prevented. Most routine colorectal cancer screening begins at the age of 50 years and continues through age 75 years. However, your health care provider may recommend screening at an earlier age if you have risk factors for colon cancer. On a yearly basis, your health care provider may provide home test kits to check for hidden blood in the stool. Use of a small camera at the end of a tube, to directly examine the colon (sigmoidoscopy or colonoscopy), can detect the earliest forms of colorectal cancer. Talk to your health care provider about this at age 50, when routine screening begins. Direct  exam of the colon should be repeated every 5-10 years through age 75 years, unless early forms of pre-cancerous polyps or small growths are found.  People who are at an increased risk for hepatitis B should be screened for this virus. You are considered at high risk for hepatitis B if:  You were born in a country where hepatitis B occurs often. Talk with your health care provider about which countries are considered high risk.  Your parents were born in a high-risk country and you have not received a shot to protect against hepatitis B (hepatitis B vaccine).  You have HIV or AIDS.  You use needles to inject street drugs.  You live with, or have sex with, someone who has hepatitis B.  You get hemodialysis treatment.  You take certain medicines for conditions like cancer, organ transplantation, and autoimmune conditions.  Hepatitis C blood testing is recommended for all people born from 1945 through 1965 and any individual with known risks for hepatitis C.  Practice safe sex. Use condoms and avoid high-risk sexual practices to reduce the spread of sexually transmitted infections (STIs). STIs include gonorrhea, chlamydia, syphilis, trichomonas, herpes, HPV, and human immunodeficiency virus (HIV). Herpes, HIV, and HPV are viral illnesses that have no cure. They can result in disability, cancer, and death.  You should be screened for sexually transmitted illnesses (STIs) including gonorrhea and chlamydia if:  You are sexually active and are younger than 24 years.  You are older than 24 years and your health care provider tells you that you are at risk for this type of infection.  Your sexual activity has changed since you were last screened and you are at an increased risk for chlamydia or gonorrhea. Ask your health care provider if you are at risk.  If you are at risk of being infected with HIV, it is recommended that you take a prescription medicine daily to prevent HIV infection. This is  called preexposure prophylaxis (PrEP). You are considered at risk if:  You are a heterosexual woman, are sexually active, and are at increased risk for HIV infection.  You take drugs by injection.  You are sexually active with a partner who has HIV.  Talk with your health care provider about whether you are at high risk of being infected with HIV. If you choose to begin PrEP, you should first be tested for HIV. You should then be tested every 3 months for as long as you are taking PrEP.  Osteoporosis is a disease in which the bones lose minerals and strength   with aging. This can result in serious bone fractures or breaks. The risk of osteoporosis can be identified using a bone density scan. Women ages 65 years and over and women at risk for fractures or osteoporosis should discuss screening with their health care providers. Ask your health care provider whether you should take a calcium supplement or vitamin D to reduce the rate of osteoporosis.  Menopause can be associated with physical symptoms and risks. Hormone replacement therapy is available to decrease symptoms and risks. You should talk to your health care provider about whether hormone replacement therapy is right for you.  Use sunscreen. Apply sunscreen liberally and repeatedly throughout the day. You should seek shade when your shadow is shorter than you. Protect yourself by wearing long sleeves, pants, a wide-brimmed hat, and sunglasses year round, whenever you are outdoors.  Once a month, do a whole body skin exam, using a mirror to look at the skin on your back. Tell your health care provider of new moles, moles that have irregular borders, moles that are larger than a pencil eraser, or moles that have changed in shape or color.  Stay current with required vaccines (immunizations).  Influenza vaccine. All adults should be immunized every year.  Tetanus, diphtheria, and acellular pertussis (Td, Tdap) vaccine. Pregnant women should  receive 1 dose of Tdap vaccine during each pregnancy. The dose should be obtained regardless of the length of time since the last dose. Immunization is preferred during the 27th-36th week of gestation. An adult who has not previously received Tdap or who does not know her vaccine status should receive 1 dose of Tdap. This initial dose should be followed by tetanus and diphtheria toxoids (Td) booster doses every 10 years. Adults with an unknown or incomplete history of completing a 3-dose immunization series with Td-containing vaccines should begin or complete a primary immunization series including a Tdap dose. Adults should receive a Td booster every 10 years.  Varicella vaccine. An adult without evidence of immunity to varicella should receive 2 doses or a second dose if she has previously received 1 dose. Pregnant females who do not have evidence of immunity should receive the first dose after pregnancy. This first dose should be obtained before leaving the health care facility. The second dose should be obtained 4-8 weeks after the first dose.  Human papillomavirus (HPV) vaccine. Females aged 13-26 years who have not received the vaccine previously should obtain the 3-dose series. The vaccine is not recommended for use in pregnant females. However, pregnancy testing is not needed before receiving a dose. If a female is found to be pregnant after receiving a dose, no treatment is needed. In that case, the remaining doses should be delayed until after the pregnancy. Immunization is recommended for any person with an immunocompromised condition through the age of 26 years if she did not get any or all doses earlier. During the 3-dose series, the second dose should be obtained 4-8 weeks after the first dose. The third dose should be obtained 24 weeks after the first dose and 16 weeks after the second dose.  Zoster vaccine. One dose is recommended for adults aged 60 years or older unless certain conditions are  present.  Measles, mumps, and rubella (MMR) vaccine. Adults born before 1957 generally are considered immune to measles and mumps. Adults born in 1957 or later should have 1 or more doses of MMR vaccine unless there is a contraindication to the vaccine or there is laboratory evidence of immunity to   each of the three diseases. A routine second dose of MMR vaccine should be obtained at least 28 days after the first dose for students attending postsecondary schools, health care workers, or international travelers. People who received inactivated measles vaccine or an unknown type of measles vaccine during 1963-1967 should receive 2 doses of MMR vaccine. People who received inactivated mumps vaccine or an unknown type of mumps vaccine before 1979 and are at high risk for mumps infection should consider immunization with 2 doses of MMR vaccine. For females of childbearing age, rubella immunity should be determined. If there is no evidence of immunity, females who are not pregnant should be vaccinated. If there is no evidence of immunity, females who are pregnant should delay immunization until after pregnancy. Unvaccinated health care workers born before 1957 who lack laboratory evidence of measles, mumps, or rubella immunity or laboratory confirmation of disease should consider measles and mumps immunization with 2 doses of MMR vaccine or rubella immunization with 1 dose of MMR vaccine.  Pneumococcal 13-valent conjugate (PCV13) vaccine. When indicated, a person who is uncertain of her immunization history and has no record of immunization should receive the PCV13 vaccine. An adult aged 19 years or older who has certain medical conditions and has not been previously immunized should receive 1 dose of PCV13 vaccine. This PCV13 should be followed with a dose of pneumococcal polysaccharide (PPSV23) vaccine. The PPSV23 vaccine dose should be obtained at least 8 weeks after the dose of PCV13 vaccine. An adult aged 19  years or older who has certain medical conditions and previously received 1 or more doses of PPSV23 vaccine should receive 1 dose of PCV13. The PCV13 vaccine dose should be obtained 1 or more years after the last PPSV23 vaccine dose.  Pneumococcal polysaccharide (PPSV23) vaccine. When PCV13 is also indicated, PCV13 should be obtained first. All adults aged 65 years and older should be immunized. An adult younger than age 65 years who has certain medical conditions should be immunized. Any person who resides in a nursing home or long-term care facility should be immunized. An adult smoker should be immunized. People with an immunocompromised condition and certain other conditions should receive both PCV13 and PPSV23 vaccines. People with human immunodeficiency virus (HIV) infection should be immunized as soon as possible after diagnosis. Immunization during chemotherapy or radiation therapy should be avoided. Routine use of PPSV23 vaccine is not recommended for American Indians, [place] Natives, or people younger than 65 years unless there are medical conditions that require PPSV23 vaccine. When indicated, people who have unknown immunization and have no record of immunization should receive PPSV23 vaccine. One-time revaccination 5 years after the first dose of PPSV23 is recommended for people aged 19-64 years who have chronic kidney failure, nephrotic syndrome, asplenia, or immunocompromised conditions. People who received 1-2 doses of PPSV23 before age 65 years should receive another dose of PPSV23 vaccine at age 65 years or later if at least 5 years have passed since the previous dose. Doses of PPSV23 are not needed for people immunized with PPSV23 at or after age 65 years.  Meningococcal vaccine. Adults with asplenia or persistent complement component deficiencies should receive 2 doses of quadrivalent meningococcal conjugate (MenACWY-D) vaccine. The doses should be obtained at least 2 months apart.  Microbiologists working with certain meningococcal bacteria, military recruits, people at risk during an outbreak, and people who travel to or live in countries with a high rate of meningitis should be immunized. A first-year college student up through age   21 years who is living in a residence hall should receive a dose if she did not receive a dose on or after her 16th birthday. Adults who have certain high-risk conditions should receive one or more doses of vaccine.  Hepatitis A vaccine. Adults who wish to be protected from this disease, have certain high-risk conditions, work with hepatitis A-infected animals, work in hepatitis A research labs, or travel to or work in countries with a high rate of hepatitis A should be immunized. Adults who were previously unvaccinated and who anticipate close contact with an international adoptee during the first 60 days after arrival in the [place] from a country with a high rate of hepatitis A should be immunized.  Hepatitis B vaccine. Adults who wish to be protected from this disease, have certain high-risk conditions, may be exposed to blood or other infectious body fluids, are household contacts or sex partners of hepatitis B positive people, are clients or workers in certain care facilities, or travel to or work in countries with a high rate of hepatitis B should be immunized.  Haemophilus influenzae type b (Hib) vaccine. A previously unvaccinated person with asplenia or sickle cell disease or having a scheduled splenectomy should receive 1 dose of Hib vaccine. Regardless of previous immunization, a recipient of a hematopoietic stem cell transplant should receive a 3-dose series 6-12 months after her successful transplant. Hib vaccine is not recommended for adults with HIV infection. Preventive Services / Frequency Ages 64 to 68 years  Blood pressure check.** / Every 1 to 2 years.  Lipid and cholesterol check.** / Every 5 years beginning at age  22.  Clinical breast exam.** / Every 3 years for women in their 88s and 53s.  BRCA-related cancer risk assessment.** / For women who have family members with a BRCA-related cancer (breast, ovarian, tubal, or peritoneal cancers).  Pap test.** / Every 2 years from ages 90 through 51. Every 3 years starting at age 21 through age 56 or 3 with a history of 3 consecutive normal Pap tests.  HPV screening.** / Every 3 years from ages 24 through ages 1 to 46 with a history of 3 consecutive normal Pap tests.  Hepatitis C blood test.** / For any individual with known risks for hepatitis C.  Skin self-exam. / Monthly.  Influenza vaccine. / Every year.  Tetanus, diphtheria, and acellular pertussis (Tdap, Td) vaccine.** / Consult your health care provider. Pregnant women should receive 1 dose of Tdap vaccine during each pregnancy. 1 dose of Td every 10 years.  Varicella vaccine.** / Consult your health care provider. Pregnant females who do not have evidence of immunity should receive the first dose after pregnancy.  HPV vaccine. / 3 doses over 6 months, if 72 and younger. The vaccine is not recommended for use in pregnant females. However, pregnancy testing is not needed before receiving a dose.  Measles, mumps, rubella (MMR) vaccine.** / You need at least 1 dose of MMR if you were born in 1957 or later. You may also need a 2nd dose. For females of childbearing age, rubella immunity should be determined. If there is no evidence of immunity, females who are not pregnant should be vaccinated. If there is no evidence of immunity, females who are pregnant should delay immunization until after pregnancy.  Pneumococcal 13-valent conjugate (PCV13) vaccine.** / Consult your health care provider.  Pneumococcal polysaccharide (PPSV23) vaccine.** / 1 to 2 doses if you smoke cigarettes or if you have certain conditions.  Meningococcal vaccine.** /  1 dose if you are age 19 to 21 years and a first-year college  student living in a residence hall, or have one of several medical conditions, you need to get vaccinated against meningococcal disease. You may also need additional booster doses.  Hepatitis A vaccine.** / Consult your health care provider.  Hepatitis B vaccine.** / Consult your health care provider.  Haemophilus influenzae type b (Hib) vaccine.** / Consult your health care provider. Ages 40 to 64 years  Blood pressure check.** / Every 1 to 2 years.  Lipid and cholesterol check.** / Every 5 years beginning at age 20 years.  Lung cancer screening. / Every year if you are aged 55-80 years and have a 30-pack-year history of smoking and currently smoke or have quit within the past 15 years. Yearly screening is stopped once you have quit smoking for at least 15 years or develop a health problem that would prevent you from having lung cancer treatment.  Clinical breast exam.** / Every year after age 40 years.  BRCA-related cancer risk assessment.** / For women who have family members with a BRCA-related cancer (breast, ovarian, tubal, or peritoneal cancers).  Mammogram.** / Every year beginning at age 40 years and continuing for as long as you are in good health. Consult with your health care provider.  Pap test.** / Every 3 years starting at age 30 years through age 65 or 70 years with a history of 3 consecutive normal Pap tests.  HPV screening.** / Every 3 years from ages 30 years through ages 65 to 70 years with a history of 3 consecutive normal Pap tests.  Fecal occult blood test (FOBT) of stool. / Every year beginning at age 50 years and continuing until age 75 years. You may not need to do this test if you get a colonoscopy every 10 years.  Flexible sigmoidoscopy or colonoscopy.** / Every 5 years for a flexible sigmoidoscopy or every 10 years for a colonoscopy beginning at age 50 years and continuing until age 75 years.  Hepatitis C blood test.** / For all people born from 1945 through  1965 and any individual with known risks for hepatitis C.  Skin self-exam. / Monthly.  Influenza vaccine. / Every year.  Tetanus, diphtheria, and acellular pertussis (Tdap/Td) vaccine.** / Consult your health care provider. Pregnant women should receive 1 dose of Tdap vaccine during each pregnancy. 1 dose of Td every 10 years.  Varicella vaccine.** / Consult your health care provider. Pregnant females who do not have evidence of immunity should receive the first dose after pregnancy.  Zoster vaccine.** / 1 dose for adults aged 60 years or older.  Measles, mumps, rubella (MMR) vaccine.** / You need at least 1 dose of MMR if you were born in 1957 or later. You may also need a 2nd dose. For females of childbearing age, rubella immunity should be determined. If there is no evidence of immunity, females who are not pregnant should be vaccinated. If there is no evidence of immunity, females who are pregnant should delay immunization until after pregnancy.  Pneumococcal 13-valent conjugate (PCV13) vaccine.** / Consult your health care provider.  Pneumococcal polysaccharide (PPSV23) vaccine.** / 1 to 2 doses if you smoke cigarettes or if you have certain conditions.  Meningococcal vaccine.** / Consult your health care provider.  Hepatitis A vaccine.** / Consult your health care provider.  Hepatitis B vaccine.** / Consult your health care provider.  Haemophilus influenzae type b (Hib) vaccine.** / Consult your health care provider. Ages 65   years and over  Blood pressure check.** / Every 1 to 2 years.  Lipid and cholesterol check.** / Every 5 years beginning at age 16 years.  Lung cancer screening. / Every year if you are aged 66-80 years and have a 30-pack-year history of smoking and currently smoke or have quit within the past 15 years. Yearly screening is stopped once you have quit smoking for at least 15 years or develop a health problem that would prevent you from having lung cancer  treatment.  Clinical breast exam.** / Every year after age 28 years.  BRCA-related cancer risk assessment.** / For women who have family members with a BRCA-related cancer (breast, ovarian, tubal, or peritoneal cancers).  Mammogram.** / Every year beginning at age 95 years and continuing for as long as you are in good health. Consult with your health care provider.  Pap test.** / Every 3 years starting at age 67 years through age 65 or 58 years with 3 consecutive normal Pap tests. Testing can be stopped between 65 and 70 years with 3 consecutive normal Pap tests and no abnormal Pap or HPV tests in the past 10 years.  HPV screening.** / Every 3 years from ages 59 years through ages 19 or 80 years with a history of 3 consecutive normal Pap tests. Testing can be stopped between 65 and 70 years with 3 consecutive normal Pap tests and no abnormal Pap or HPV tests in the past 10 years.  Fecal occult blood test (FOBT) of stool. / Every year beginning at age 44 years and continuing until age 76 years. You may not need to do this test if you get a colonoscopy every 10 years.  Flexible sigmoidoscopy or colonoscopy.** / Every 5 years for a flexible sigmoidoscopy or every 10 years for a colonoscopy beginning at age 23 years and continuing until age 1 years.  Hepatitis C blood test.** / For all people born from [birth year] through 1965 and any individual with known risks for hepatitis C.  Osteoporosis screening.** / A one-time screening for women ages 29 years and over and women at risk for fractures or osteoporosis.  Skin self-exam. / Monthly.  Influenza vaccine. / Every year.  Tetanus, diphtheria, and acellular pertussis (Tdap/Td) vaccine.** / 1 dose of Td every 10 years.  Varicella vaccine.** / Consult your health care provider.  Zoster vaccine.** / 1 dose for adults aged 77 years or older.  Pneumococcal 13-valent conjugate (PCV13) vaccine.** / Consult your health care provider.  Pneumococcal  polysaccharide (PPSV23) vaccine.** / 1 dose for all adults aged 64 years and older.  Meningococcal vaccine.** / Consult your health care provider.  Hepatitis A vaccine.** / Consult your health care provider.  Hepatitis B vaccine.** / Consult your health care provider.  Haemophilus influenzae type b (Hib) vaccine.** / Consult your health care provider. ** Family history and personal history of risk and conditions may change your health care provider's recommendations. Document Released: 09/15/2001 Document Revised: 12/04/2013 Document Reviewed: 12/15/2010 [facility] Patient Information 2015 [place], [place]. This information is not intended to replace advice given to you by your health care provider. Make sure you discuss any questions you have with your health care provider.   Cholesterol Cholesterol is a white, waxy, fat-like substance needed by your body in small amounts. The liver makes all the cholesterol you need. Cholesterol is carried from the liver by the blood through the blood vessels. Deposits of cholesterol (plaque) may build up on blood vessel walls. These make the arteries narrower  and stiffer. Cholesterol plaques increase the risk for heart attack and stroke.  You cannot feel your cholesterol level even if it is very high. The only way to know it is high is with a blood test. Once you know your cholesterol levels, you should keep a record of the test results. Work with your health care provider to keep your levels in the desired range.  WHAT DO THE RESULTS MEAN?  Total cholesterol is a rough measure of all the cholesterol in your blood.   LDL is the so-called bad cholesterol. This is the type that deposits cholesterol in the walls of the arteries. You want this level to be low.   HDL is the good cholesterol because it cleans the arteries and carries the LDL away. You want this level to be high.  Triglycerides are fat that the body can either burn for energy or store. High  levels are closely linked to heart disease.  WHAT ARE THE DESIRED LEVELS OF CHOLESTEROL?  Total cholesterol below 200.   LDL below 100 for people at risk, below 70 for those at very high risk.   HDL above 50 is good, above 60 is best.   Triglycerides below 150.  HOW CAN I LOWER MY CHOLESTEROL?  Diet. Follow your diet programs as directed by your health care provider.   Choose fish or white meat chicken and [place], roasted or baked. Limit fatty cuts of red meat, fried foods, and processed meats, such as sausage and lunch meats.   Eat lots of fresh fruits and vegetables.  Choose whole grains, beans, pasta, potatoes, and cereals.   Use only small amounts of olive, corn, or canola oils.   Avoid butter, mayonnaise, shortening, or palm kernel oils.  Avoid foods with trans fats.   Drink skim or nonfat milk and eat low-fat or nonfat yogurt and cheeses. Avoid whole milk, cream, ice cream, egg yolks, and full-fat cheeses.   Healthy desserts include angel food cake, ginger snaps, animal crackers, hard candy, popsicles, and low-fat or nonfat frozen yogurt. Avoid pastries, cakes, pies, and cookies.   Exercise. Follow your exercise programs as directed by your health care provider.   A regular program helps decrease LDL and raise HDL.   A regular program helps with weight control.   Do things that increase your activity level like gardening, walking, or taking the stairs. Ask your health care provider about how you can be more active in your daily life.   Medicine. Take medicine only as directed by your health care provider.   Medicine may be prescribed by your health care provider to help lower cholesterol and decrease the risk for heart disease.   If you have several risk factors, you may need medicine even if your levels are normal. Document Released: 04/14/2001 Document Revised: 12/04/2013 Document Reviewed: 05/03/2013 [facility] Patient Information 2015 [place],  [place]. This information is not intended to replace advice given to you by your health care provider. Make sure you discuss any questions you have with your health care provider.

## 2015-03-13 NOTE — Addendum Note (Signed)
Addended by: Maryla Morrow on: 03/13/2015 01:11 PM   Modules accepted: Orders

## 2015-09-10 ENCOUNTER — Other Ambulatory Visit: Payer: Self-pay | Admitting: Internal Medicine

## 2015-09-10 DIAGNOSIS — Z1231 Encounter for screening mammogram for malignant neoplasm of breast: Secondary | ICD-10-CM

## 2015-09-12 ENCOUNTER — Encounter: Payer: PRIVATE HEALTH INSURANCE | Admitting: Osteopathic Medicine

## 2015-09-23 ENCOUNTER — Ambulatory Visit: Payer: PRIVATE HEALTH INSURANCE

## 2015-10-03 ENCOUNTER — Ambulatory Visit
Admission: RE | Admit: 2015-10-03 | Discharge: 2015-10-03 | Disposition: A | Discharge status: Home / Self Care | Payer: BLUE CROSS/BLUE SHIELD | Source: Ambulatory Visit | Attending: Internal Medicine | Admitting: Internal Medicine

## 2015-10-03 DIAGNOSIS — Z1231 Encounter for screening mammogram for malignant neoplasm of breast: Secondary | ICD-10-CM

## 2015-11-12 ENCOUNTER — Other Ambulatory Visit (HOSPITAL_COMMUNITY)
Admission: RE | Admit: 2015-11-12 | Discharge: 2015-11-12 | Disposition: A | Discharge status: Home / Self Care | Payer: BLUE CROSS/BLUE SHIELD | Source: Ambulatory Visit | Attending: Internal Medicine | Admitting: Internal Medicine

## 2015-11-12 ENCOUNTER — Other Ambulatory Visit: Payer: Self-pay | Admitting: Internal Medicine

## 2015-11-12 DIAGNOSIS — Z01419 Encounter for gynecological examination (general) (routine) without abnormal findings: Secondary | ICD-10-CM | POA: Diagnosis present

## 2015-11-12 DIAGNOSIS — Z1151 Encounter for screening for human papillomavirus (HPV): Secondary | ICD-10-CM | POA: Insufficient documentation

## 2015-11-19 LAB — CYTOLOGY - PAP

## 2016-09-24 ENCOUNTER — Other Ambulatory Visit: Payer: Self-pay | Admitting: Internal Medicine

## 2016-09-24 DIAGNOSIS — Z1231 Encounter for screening mammogram for malignant neoplasm of breast: Secondary | ICD-10-CM

## 2016-10-13 ENCOUNTER — Ambulatory Visit
Admission: RE | Admit: 2016-10-13 | Discharge: 2016-10-13 | Disposition: A | Discharge status: Home / Self Care | Payer: BLUE CROSS/BLUE SHIELD | Source: Ambulatory Visit | Attending: Internal Medicine | Admitting: Internal Medicine

## 2016-10-13 DIAGNOSIS — Z1231 Encounter for screening mammogram for malignant neoplasm of breast: Secondary | ICD-10-CM
